# Patient Record
Sex: Female | Born: 1960 | Race: White | Hispanic: No | Marital: Married | Smoking: Former smoker
Health system: Southern US, Community
[De-identification: ages and names within clinical notes are randomized; demographics above are authoritative.]

## PROBLEM LIST (undated history)

## (undated) DIAGNOSIS — K449 Diaphragmatic hernia without obstruction or gangrene: Secondary | ICD-10-CM

## (undated) DIAGNOSIS — N8110 Cystocele, unspecified: Secondary | ICD-10-CM

## (undated) HISTORY — PX: ABDOMINAL HYSTERECTOMY: SHX81

## (undated) HISTORY — PX: TONSILLECTOMY: SUR1361

---

## 2011-12-21 DIAGNOSIS — K449 Diaphragmatic hernia without obstruction or gangrene: Secondary | ICD-10-CM

## 2011-12-21 DIAGNOSIS — N8110 Cystocele, unspecified: Secondary | ICD-10-CM

## 2011-12-21 HISTORY — DX: Cystocele, unspecified: N81.10

## 2011-12-21 HISTORY — DX: Diaphragmatic hernia without obstruction or gangrene: K44.9

## 2015-02-28 ENCOUNTER — Ambulatory Visit: Payer: Self-pay | Admitting: Internal Medicine

## 2015-04-17 ENCOUNTER — Other Ambulatory Visit: Payer: Self-pay | Admitting: Nurse Practitioner

## 2015-04-17 DIAGNOSIS — R1013 Epigastric pain: Principal | ICD-10-CM

## 2015-04-17 DIAGNOSIS — R110 Nausea: Secondary | ICD-10-CM

## 2015-04-17 DIAGNOSIS — G8929 Other chronic pain: Secondary | ICD-10-CM

## 2015-04-23 ENCOUNTER — Ambulatory Visit: Payer: Self-pay

## 2015-04-30 ENCOUNTER — Ambulatory Visit
Admission: RE | Admit: 2015-04-30 | Discharge: 2015-04-30 | Disposition: A | Discharge status: Home / Self Care | Payer: BLUE CROSS/BLUE SHIELD | Source: Ambulatory Visit | Attending: Nurse Practitioner | Admitting: Nurse Practitioner

## 2015-04-30 DIAGNOSIS — K824 Cholesterolosis of gallbladder: Secondary | ICD-10-CM | POA: Diagnosis not present

## 2015-04-30 DIAGNOSIS — R110 Nausea: Secondary | ICD-10-CM

## 2015-04-30 DIAGNOSIS — R1013 Epigastric pain: Secondary | ICD-10-CM | POA: Insufficient documentation

## 2015-04-30 DIAGNOSIS — G8929 Other chronic pain: Secondary | ICD-10-CM

## 2015-04-30 MED ORDER — SINCALIDE 5 MCG IJ SOLR
0.0200 ug/kg | Freq: Once | INTRAMUSCULAR | Status: AC
  Administered 2015-04-30: 1.4 ug via INTRAVENOUS
  Filled 2015-04-30: qty 5

## 2015-04-30 MED ORDER — TECHNETIUM TC 99M MEBROFENIN IV KIT
5.4400 | PACK | Freq: Once | INTRAVENOUS | Status: AC | PRN
  Administered 2015-04-30: 5.44 via INTRAVENOUS

## 2015-06-24 ENCOUNTER — Encounter
Admission: RE | Admit: 2015-06-24 | Discharge: 2015-06-24 | Disposition: A | Discharge status: Home / Self Care | Payer: BLUE CROSS/BLUE SHIELD | Source: Ambulatory Visit | Attending: Surgery | Admitting: Surgery

## 2015-06-24 DIAGNOSIS — Z01812 Encounter for preprocedural laboratory examination: Secondary | ICD-10-CM | POA: Insufficient documentation

## 2015-06-24 DIAGNOSIS — K811 Chronic cholecystitis: Secondary | ICD-10-CM | POA: Diagnosis not present

## 2015-06-24 HISTORY — DX: Diaphragmatic hernia without obstruction or gangrene: K44.9

## 2015-06-24 HISTORY — DX: Cystocele, unspecified: N81.10

## 2015-06-24 LAB — COMPREHENSIVE METABOLIC PANEL
ALT: 12 U/L — ABNORMAL LOW (ref 14–54)
AST: 16 U/L (ref 15–41)
Albumin: 4 g/dL (ref 3.5–5.0)
Alkaline Phosphatase: 84 U/L (ref 38–126)
Anion gap: 7 (ref 5–15)
BILIRUBIN TOTAL: 0.7 mg/dL (ref 0.3–1.2)
BUN: 14 mg/dL (ref 6–20)
CHLORIDE: 106 mmol/L (ref 101–111)
CO2: 28 mmol/L (ref 22–32)
Calcium: 9.3 mg/dL (ref 8.9–10.3)
Creatinine, Ser: 0.72 mg/dL (ref 0.44–1.00)
GFR calc Af Amer: >60 mL/min (ref >60)
GFR calc non Af Amer: >60 mL/min (ref >60)
Glucose, Bld: 90 mg/dL (ref 65–99)
Potassium: 4.3 mmol/L (ref 3.5–5.1)
Sodium: 141 mmol/L (ref 135–145)
Total Protein: 6.8 g/dL (ref 6.5–8.1)

## 2015-06-24 LAB — CBC
HEMATOCRIT: 43.3 % (ref 35.0–47.0)
HEMOGLOBIN: 14.6 g/dL (ref 12.0–16.0)
MCH: 31.6 pg (ref 26.0–34.0)
MCHC: 33.7 g/dL (ref 32.0–36.0)
MCV: 93.7 fL (ref 80.0–100.0)
Platelets: 247 10*3/uL (ref 150–440)
RBC: 4.62 MIL/uL (ref 3.80–5.20)
RDW: 12.6 % (ref 11.5–14.5)
WBC: 7.3 10*3/uL (ref 3.6–11.0)

## 2015-06-24 LAB — DIFFERENTIAL
BASOS ABS: 0.1 10*3/uL (ref 0–0.1)
BASOS PCT: 1 %
Eosinophils Absolute: 0.1 10*3/uL (ref 0–0.7)
Eosinophils Relative: 2 %
Lymphocytes Relative: 25 %
Lymphs Abs: 1.8 10*3/uL (ref 1.0–3.6)
Monocytes Absolute: 0.6 10*3/uL (ref 0.2–0.9)
Monocytes Relative: 8 %
NEUTROS PCT: 64 %
Neutro Abs: 4.6 10*3/uL (ref 1.4–6.5)

## 2015-06-24 NOTE — Patient Instructions (Signed)
  Your procedure is scheduled on: Thursday July 03, 2015 Report to Same Day Surgery. To find out your arrival time please call [phone] between 1PM - 3PM on July 02, 2015  Remember: Instructions that are not followed completely may result in serious medical risk, up to and including death, or upon the discretion of your surgeon and anesthesiologist your surgery may need to be rescheduled.    _x___ 1. Do not eat food or drink liquids after midnight. No gum chewing or hard candies.     _x___ 2. No Alcohol for 24 hours before or after surgery.   ____ 3. Bring all medications with you on the day of surgery if instructed.    _x___ 4. Notify your doctor if there is any change in your medical condition     (cold, fever, infections).     Do not wear jewelry, make-up, hairpins, clips or nail polish.  Do not wear lotions, powders, or perfumes. You may wear deodorant.  Do not shave 48 hours prior to surgery. Men may shave face and neck.  Do not bring valuables to the hospital.    [facility] not [facility] any belongings or valuables.               Contacts, dentures or bridgework may not be worn into surgery.  Leave your suitcase in the car. After surgery it may be brought to your room.  For patients admitted to the hospital, discharge time is determined by your treatment team.   Patients discharged the day of surgery will not be allowed to drive home.    Please read over the following fact sheets that you were given:   [facility] Pre[facility]  __x__ Take these medicines the morning of surgery with A SIP OF WATER:    1. esomeprazole (NEXIUM)    ____ Fleet Enema (as directed)   __x__ Use CHG Soap as directed  ____ Use inhalers on the day of surgery  ____ Stop metformin 2 days prior to surgery    ____ Take 1/2 of usual insulin dose the night before surgery and none on the morning of surgery.   ____ Stop Coumadin/Plavix/aspirin on Does not apply.  ____ Stop  Anti-inflammatories on now.  Tylenol is ok for pain.   ____ Stop supplements until after surgery.    ____ Bring C-Pap to the hospital.

## 2015-07-03 ENCOUNTER — Encounter: Payer: Self-pay | Admitting: *Deleted

## 2015-07-03 ENCOUNTER — Ambulatory Visit: Payer: BLUE CROSS/BLUE SHIELD

## 2015-07-03 ENCOUNTER — Ambulatory Visit
Admission: RE | Admit: 2015-07-03 | Discharge: 2015-07-03 | Disposition: A | Discharge status: Home / Self Care | Payer: BLUE CROSS/BLUE SHIELD | Source: Ambulatory Visit | Attending: Surgery | Admitting: Surgery

## 2015-07-03 ENCOUNTER — Encounter
Admission: RE | Disposition: A | Discharge status: Home / Self Care | Payer: Self-pay | Source: Ambulatory Visit | Attending: Surgery

## 2015-07-03 ENCOUNTER — Ambulatory Visit: Payer: BLUE CROSS/BLUE SHIELD | Admitting: Registered Nurse

## 2015-07-03 DIAGNOSIS — Z8049 Family history of malignant neoplasm of other genital organs: Secondary | ICD-10-CM | POA: Insufficient documentation

## 2015-07-03 DIAGNOSIS — Z8249 Family history of ischemic heart disease and other diseases of the circulatory system: Secondary | ICD-10-CM | POA: Insufficient documentation

## 2015-07-03 DIAGNOSIS — Z87891 Personal history of nicotine dependence: Secondary | ICD-10-CM | POA: Insufficient documentation

## 2015-07-03 DIAGNOSIS — R61 Generalized hyperhidrosis: Secondary | ICD-10-CM | POA: Diagnosis not present

## 2015-07-03 DIAGNOSIS — K449 Diaphragmatic hernia without obstruction or gangrene: Secondary | ICD-10-CM | POA: Insufficient documentation

## 2015-07-03 DIAGNOSIS — Z86010 Personal history of colonic polyps: Secondary | ICD-10-CM | POA: Insufficient documentation

## 2015-07-03 DIAGNOSIS — Z801 Family history of malignant neoplasm of trachea, bronchus and lung: Secondary | ICD-10-CM | POA: Diagnosis not present

## 2015-07-03 DIAGNOSIS — K811 Chronic cholecystitis: Secondary | ICD-10-CM | POA: Diagnosis not present

## 2015-07-03 DIAGNOSIS — K219 Gastro-esophageal reflux disease without esophagitis: Secondary | ICD-10-CM | POA: Insufficient documentation

## 2015-07-03 DIAGNOSIS — Z833 Family history of diabetes mellitus: Secondary | ICD-10-CM | POA: Insufficient documentation

## 2015-07-03 DIAGNOSIS — Z419 Encounter for procedure for purposes other than remedying health state, unspecified: Secondary | ICD-10-CM

## 2015-07-03 DIAGNOSIS — Z79899 Other long term (current) drug therapy: Secondary | ICD-10-CM | POA: Insufficient documentation

## 2015-07-03 HISTORY — PX: CHOLECYSTECTOMY: SHX55

## 2015-07-03 SURGERY — LAPAROSCOPIC CHOLECYSTECTOMY WITH INTRAOPERATIVE CHOLANGIOGRAM
Anesthesia: General | Wound class: Clean Contaminated

## 2015-07-03 MED ORDER — ONDANSETRON HCL 4 MG/2ML IJ SOLN
4.0000 mg | Freq: Once | INTRAMUSCULAR | Status: AC | PRN
  Administered 2015-07-03: 4 mg via INTRAVENOUS

## 2015-07-03 MED ORDER — ONDANSETRON HCL 4 MG/2ML IJ SOLN
INTRAMUSCULAR | Status: AC
  Administered 2015-07-03: 4 mg via INTRAVENOUS
  Filled 2015-07-03: qty 2

## 2015-07-03 MED ORDER — FENTANYL CITRATE (PF) 100 MCG/2ML IJ SOLN
INTRAMUSCULAR | Status: DC | PRN
  Administered 2015-07-03: 50 ug via INTRAVENOUS
  Administered 2015-07-03: 100 ug via INTRAVENOUS

## 2015-07-03 MED ORDER — ONDANSETRON HCL 4 MG/2ML IJ SOLN
INTRAMUSCULAR | Status: DC | PRN
  Administered 2015-07-03: 4 mg via INTRAVENOUS

## 2015-07-03 MED ORDER — FENTANYL CITRATE (PF) 100 MCG/2ML IJ SOLN
25.0000 ug | INTRAMUSCULAR | Status: DC | PRN
  Administered 2015-07-03 (×4): 25 ug via INTRAVENOUS

## 2015-07-03 MED ORDER — PROMETHAZINE HCL 25 MG/ML IJ SOLN
6.2500 mg | Freq: Four times a day (QID) | INTRAMUSCULAR | Status: DC | PRN
  Administered 2015-07-03: 6.25 mg via INTRAVENOUS

## 2015-07-03 MED ORDER — FENTANYL CITRATE (PF) 100 MCG/2ML IJ SOLN
INTRAMUSCULAR | Status: AC
  Administered 2015-07-03: 25 ug via INTRAVENOUS
  Filled 2015-07-03: qty 2

## 2015-07-03 MED ORDER — BUPIVACAINE-EPINEPHRINE (PF) 0.5% -1:200000 IJ SOLN
INTRAMUSCULAR | Status: AC
  Filled 2015-07-03: qty 30

## 2015-07-03 MED ORDER — MIDAZOLAM HCL 2 MG/2ML IJ SOLN
INTRAMUSCULAR | Status: DC | PRN
  Administered 2015-07-03: 2 mg via INTRAVENOUS

## 2015-07-03 MED ORDER — PROMETHAZINE HCL 25 MG/ML IJ SOLN
INTRAMUSCULAR | Status: AC
  Filled 2015-07-03: qty 1

## 2015-07-03 MED ORDER — GLYCOPYRROLATE 0.2 MG/ML IJ SOLN
INTRAMUSCULAR | Status: DC | PRN
  Administered 2015-07-03: 0.2 mg via INTRAVENOUS

## 2015-07-03 MED ORDER — HYDROCODONE-ACETAMINOPHEN 5-325 MG PO TABS: 1.0000 | ORAL_TABLET | ORAL | Status: DC | PRN

## 2015-07-03 MED ORDER — SUGAMMADEX SODIUM 200 MG/2ML IV SOLN
INTRAVENOUS | Status: DC | PRN
  Administered 2015-07-03: 50 mg via INTRAVENOUS

## 2015-07-03 MED ORDER — BUPIVACAINE-EPINEPHRINE 0.5% -1:200000 IJ SOLN
INTRAMUSCULAR | Status: DC | PRN
  Administered 2015-07-03: 23 mL

## 2015-07-03 MED ORDER — LACTATED RINGERS IV SOLN
INTRAVENOUS | Status: DC
  Administered 2015-07-03 (×2): via INTRAVENOUS

## 2015-07-03 MED ORDER — LIDOCAINE HCL 2 % EX GEL
CUTANEOUS | Status: DC | PRN
  Administered 2015-07-03: 1 via TOPICAL

## 2015-07-03 MED ORDER — SODIUM CHLORIDE 0.9 % IJ SOLN
INTRAMUSCULAR | Status: AC
  Filled 2015-07-03: qty 10

## 2015-07-03 MED ORDER — HYDROMORPHONE HCL 1 MG/ML IJ SOLN: 0.2500 mg | INTRAMUSCULAR | Status: DC | PRN

## 2015-07-03 MED ORDER — ROCURONIUM BROMIDE 100 MG/10ML IV SOLN
INTRAVENOUS | Status: DC | PRN
  Administered 2015-07-03: 50 mg via INTRAVENOUS

## 2015-07-03 MED ORDER — KETOROLAC TROMETHAMINE 30 MG/ML IJ SOLN
INTRAMUSCULAR | Status: DC | PRN
  Administered 2015-07-03: 30 mg via INTRAVENOUS

## 2015-07-03 MED ORDER — PROPOFOL 10 MG/ML IV BOLUS
INTRAVENOUS | Status: DC | PRN
  Administered 2015-07-03: 150 mg via INTRAVENOUS

## 2015-07-03 MED ORDER — LIDOCAINE HCL (CARDIAC) 20 MG/ML IV SOLN
INTRAVENOUS | Status: DC | PRN
  Administered 2015-07-03: 100 mg via INTRAVENOUS

## 2015-07-03 MED ORDER — DEXAMETHASONE SODIUM PHOSPHATE 4 MG/ML IJ SOLN
INTRAMUSCULAR | Status: DC | PRN
  Administered 2015-07-03: 10 mg via INTRAVENOUS

## 2015-07-03 MED ORDER — SODIUM CHLORIDE 0.9 % IV SOLN
INTRAVENOUS | Status: DC | PRN
  Administered 2015-07-03: 8 mL

## 2015-07-03 MED ORDER — HEPARIN SODIUM (PORCINE) 5000 UNIT/ML IJ SOLN
INTRAMUSCULAR | Status: AC
  Filled 2015-07-03: qty 1

## 2015-07-03 MED ORDER — SODIUM CHLORIDE 0.9 % IV SOLN
INTRAVENOUS | Status: DC | PRN
  Administered 2015-07-03: 1 mL via INTRAMUSCULAR

## 2015-07-03 SURGICAL SUPPLY — 36 items
APPLIER CLIP ROT 10 11.4 M/L (STAPLE) ×3
CANISTER SUCT 1200ML W/VALVE (MISCELLANEOUS) ×3 IMPLANT
CANNULA DILATOR 12 W/SLV (CANNULA) ×2 IMPLANT
CANNULA DILATOR 12MM W/SLV (CANNULA) ×1
CATH REDDICK CHOLANGI 4FR 50CM (CATHETERS) ×3 IMPLANT
CHLORAPREP W/TINT 26ML (MISCELLANEOUS) ×3 IMPLANT
CLIP APPLIE ROT 10 11.4 M/L (STAPLE) ×1 IMPLANT
CLOSURE WOUND 1/2 X4 (GAUZE/BANDAGES/DRESSINGS) ×1
DRAPE SHEET LG 3/4 BI-LAMINATE (DRAPES) ×3 IMPLANT
GAUZE SPONGE 4X4 12PLY STRL (GAUZE/BANDAGES/DRESSINGS) ×3 IMPLANT
GLOVE BIO SURGEON STRL SZ7.5 (GLOVE) ×3 IMPLANT
GOWN STRL REUS W/ TWL LRG LVL3 (GOWN DISPOSABLE) ×4 IMPLANT
GOWN STRL REUS W/TWL LRG LVL3 (GOWN DISPOSABLE) ×8
IRRIGATION STRYKERFLOW (MISCELLANEOUS) ×1 IMPLANT
IRRIGATOR STRYKERFLOW (MISCELLANEOUS) ×3
IV NS 1000ML (IV SOLUTION) ×2
IV NS 1000ML BAXH (IV SOLUTION) ×1 IMPLANT
KIT RM TURNOVER STRD PROC AR (KITS) ×3 IMPLANT
LABEL OR SOLS (LABEL) ×3 IMPLANT
NEEDLE FILTER BLUNT 18X 1/2SAF (NEEDLE) ×2
NEEDLE FILTER BLUNT 18X1 1/2 (NEEDLE) ×1 IMPLANT
NEEDLE HYPO 22GX1.5 SAFETY (NEEDLE) ×3 IMPLANT
NS IRRIG 500ML POUR BTL (IV SOLUTION) ×3 IMPLANT
PACK LAP CHOLECYSTECTOMY (MISCELLANEOUS) ×3 IMPLANT
PAD GROUND ADULT SPLIT (MISCELLANEOUS) ×3 IMPLANT
SCISSORS METZENBAUM CVD 33 (INSTRUMENTS) ×3 IMPLANT
SEAL FOR SCOPE WARMER C3101 (MISCELLANEOUS) ×3 IMPLANT
SLEEVE ENDOPATH XCEL 5M (ENDOMECHANICALS) ×3 IMPLANT
STRIP CLOSURE SKIN 1/2X4 (GAUZE/BANDAGES/DRESSINGS) ×2 IMPLANT
SUT CHROMIC 5 0 RB 1 27 (SUTURE) ×6 IMPLANT
SUT VIC AB 0 CT2 27 (SUTURE) IMPLANT
SYR 3ML LL SCALE MARK (SYRINGE) ×3 IMPLANT
TROCAR XCEL NON-BLD 11X100MML (ENDOMECHANICALS) ×3 IMPLANT
TROCAR XCEL NON-BLD 5MMX100MML (ENDOMECHANICALS) ×3 IMPLANT
TUBING INSUFFLATOR HI FLOW (MISCELLANEOUS) ×3 IMPLANT
WATER STERILE IRR 1000ML POUR (IV SOLUTION) ×3 IMPLANT

## 2015-07-03 NOTE — H&P (Signed)
  She reports some recent nausea improved with Zofran. No other change in condition.  Discussed plan for surgery

## 2015-07-03 NOTE — Op Note (Signed)
OPERATIVE REPORT  PREOPERATIVE DIAGNOSIS:  Chronic cholecystitis   POSTOPERATIVE DIAGNOSIS: Chronic cholecystitis   PROCEDURE: Laparoscopic cholecystectomy with cholangiogram  ANESTHESIA: General  SURGEON: Renda RollsWilton Jaisha Villacres M.D.  INDICATIONS: She is a history of epigastric pain and nausea and abnormally low cholecystokinin ejection fraction of 13%  With the patient on the operating table in the supine position under general endotracheal anesthesia the abdomen was prepared with ChloraPrep solution and draped in a sterile manner. A short incision was made in the inferior aspect of the umbilicus and carried down to the deep fascia which was grasped with a laryngeal hook. A Veress needle was inserted into the peritoneal cavity. The needle was aspirated and irrigated with a saline solution. The peritoneal cavity was insufflated with carbon dioxide. Initial inspection revealed that there was distention of the stomach. I had the anesthetist insert an oral gastric tube was decompressed the stomach. There were some adhesions between the omentum and the lower abdominal wall. The liver appeared to have a smooth surface.  Another incision was made in the epigastrium slightly to the right of the midline to introduce an 11 mm cannula. 2 incisions were made in the lateral aspect of the right upper quadrant to introduce two  5 mm cannulas.   The gallbladder was retracted towards the right shoulder.   The gallbladder neck was retracted inferiorly and laterally.  The porta hepatis was identified. The gallbladder was mobilized with incision of the visceral peritoneum. The cystic duct was dissected free from surrounding structures. The cystic artery was dissected free from surrounding structures. A critical view of safety was demonstrated. The cystic artery was controlled with 2 endoclips proximally 1 distally and divided. This allowed better traction on the cystic duct. An Endo Clip was placed across the cystic duct  adjacent to the gallbladder neck. An incision was made in the cystic duct to introduce a Reddick catheter. The cholangiogram was done with injection of half-strength Conray 60 dye. This demonstrated the bile ducts and flow of dye into the duodenum. No retained stones were identified. The cholangiogram appeared normal. The Reddick catheter was removed. The cystic duct was doubly ligated with endoclips and divided. . The gallbladder was dissected free from the liver with use of hook and cautery and blunt dissection. Bleeding was minimal and hemostasis was intact. The gallbladder was delivered up through the infraumbilical incision opened and suctioned.  The gallbladder was removed and submitted in formalin for routine pathology. The right upper quadrant was further inspected and aspirated a small amount of serosanguineous fluid and could see that hemostasis was intact. The cannulas were removed. There was some minimal bleeding from the epigastric cannula site. The site was infiltrated with half percent Sensorcaine with epinephrine and hemostasis surgically appear to be intact. Subcutaneous tissues for each wound was further infiltrated with half percent Sensorcaine with epinephrine. The skin incisions were closed with interrupted 5-0 chromic subcutaneous suture benzoin and Steri-Strips. Gauze dressings were applied with paper tape.  The patient appeared to be in satisfactory condition and was prepared for transfer to the recovery room  Renda RollsWilton Toshiye Kever M.D.

## 2015-07-03 NOTE — Transfer of Care (Signed)
Immediate Anesthesia Transfer of Care Note  Patient: Debbie PhillipsNancy Brock  Procedure(s) Performed: Procedure(s): LAPAROSCOPIC CHOLECYSTECTOMY WITH INTRAOPERATIVE CHOLANGIOGRAM (N/A)  Patient Location: PACU  Anesthesia Type:General  Level of Consciousness: sedated  Airway & Oxygen Therapy: Patient Spontanous Breathing and Patient connected to face mask oxygen  Post-op Assessment: Report given to RN and Post -op Vital signs reviewed and stable  Post vital signs: Reviewed and stable  Last Vitals:  Filed Vitals:   07/03/15 1458  BP: 143/80  Pulse: 87  Temp: 36.1 C  Resp: 15    Complications: No apparent anesthesia complications

## 2015-07-03 NOTE — Anesthesia Postprocedure Evaluation (Signed)
  Anesthesia Post-op Note  Patient: Eli PhillipsNancy Wissink  Procedure(s) Performed: Procedure(s): LAPAROSCOPIC CHOLECYSTECTOMY WITH INTRAOPERATIVE CHOLANGIOGRAM (N/A)  Anesthesia type:General  Patient location: PACU  Post pain: Pain level controlled  Post assessment: Post-op Vital signs reviewed, Patient's Cardiovascular Status Stable, Respiratory Function Stable, Patent Airway and No signs of Nausea or vomiting  Post vital signs: Reviewed and stable  Last Vitals:  Filed Vitals:   07/03/15 1458  BP: 143/80  Pulse: 87  Temp: 36.1 C  Resp: 15    Level of consciousness: awake, alert  and patient cooperative  Complications: No apparent anesthesia complications

## 2015-07-03 NOTE — Anesthesia Procedure Notes (Signed)
Procedure Name: Intubation Date/Time: 07/03/2015 1:06 PM Performed by: Stormy FabianURTIS, Sameera Betton Pre-anesthesia Checklist: Patient identified, Patient being monitored, Timeout performed, Emergency Drugs available and Suction available Patient Re-evaluated:Patient Re-evaluated prior to inductionOxygen Delivery Method: Circle system utilized Preoxygenation: Pre-oxygenation with 100% oxygen Intubation Type: IV induction Ventilation: Mask ventilation without difficulty Laryngoscope Size: Mac and 3 Grade View: Grade I Tube type: Oral Tube size: 7.0 mm Number of attempts: 1 Airway Equipment and Method: Stylet Placement Confirmation: ETT inserted through vocal cords under direct vision,  positive ETCO2 and breath sounds checked- equal and bilateral Secured at: 21 cm Tube secured with: Tape Dental Injury: Teeth and Oropharynx as per pre-operative assessment  Comments: Anterior airway; short chin

## 2015-07-03 NOTE — Discharge Instructions (Addendum)
Take Tylenol or Norco if needed for pain.  Take Zofran if needed for nausea.  Remove dressings on Friday. May shower Saturday.AMBULATORY SURGERY  DISCHARGE INSTRUCTIONS   1) The drugs that you were given will stay in your system until tomorrow so for the next 24 hours you should not:  A) Drive an automobile B) Make any legal decisions C) Drink any alcoholic beverage   2) You may resume regular meals tomorrow.  Today it is better to start with liquids and gradually work up to solid foods.  You may eat anything you prefer, but it is better to start with liquids, then soup and crackers, and gradually work up to solid foods.   3) Please notify your doctor immediately if you have any unusual bleeding, trouble breathing, redness and pain at the surgery site, drainage, fever, or pain not relieved by medication.    4) Additional Instructions:      Laparoscopic Cholecystectomy, Care After Refer to this sheet in the next few weeks. These instructions provide you with information on caring for yourself after your procedure. Your health care provider may also give you more specific instructions. Your treatment has been planned according to current medical practices, but problems sometimes occur. Call your health care provider if you have any problems or questions after your procedure. WHAT TO EXPECT AFTER THE PROCEDURE After your procedure, it is typical to have the following:  Pain at your incision sites. You will be given pain medicines to control the pain.  Mild nausea or vomiting. This should improve after the first 24 hours.  Bloating and possibly shoulder pain from the gas used during the procedure. This will improve after the first 24 hours. HOME CARE INSTRUCTIONS   Change bandages (dressings) as directed by your health care provider.  Keep the wound dry and clean. You may wash the wound gently with soap and water. Gently blot or dab the area dry.  Do not take baths or use  swimming pools or hot tubs for 2 weeks or until your health care provider approves.  Only take over-the-counter or prescription medicines as directed by your health care provider.  Continue your normal diet as directed by your health care provider.  Do not lift anything heavier than 10 pounds (4.5 kg) until your health care provider approves.  Do not play contact sports for 1 week or until your health care provider approves. SEEK MEDICAL CARE IF:   You have redness, swelling, or increasing pain in the wound.  You notice yellowish-white fluid (pus) coming from the wound.  You have drainage from the wound that lasts longer than 1 day.  You notice a bad smell coming from the wound or dressing.  Your surgical cuts (incisions) break open. SEEK IMMEDIATE MEDICAL CARE IF:   You develop a rash.  You have difficulty breathing.  You have chest pain.  You have a fever.  You have increasing pain in the shoulders (shoulder strap areas).  You have dizzy episodes or faint while standing.  You have severe abdominal pain.  You feel sick to your stomach (nauseous) or throw up (vomit) and this lasts for more than 1 day. Document Released: 12/06/2005 Document Revised: 09/26/2013 Document Reviewed: 07/18/2013 [facility] Patient Information 2015 [place] [place]. This information is not intended to replace advice given to you by your health care provider. Make sure you discuss any questions you have with your health care provider.   Please contact your physician with any problems or Same Day Surgery  at [phone], Monday through Friday 6 am to 4 pm, or [place] at [facility] number at [phone].

## 2015-07-03 NOTE — Anesthesia Preprocedure Evaluation (Addendum)
Anesthesia Evaluation  Patient identified by MRN, date of birth, ID band Patient awake    Reviewed: Allergy & Precautions, NPO status , Patient's Chart, lab work & pertinent test results  History of Anesthesia Complications Negative for: history of anesthetic complications  Airway Mallampati: II  TM Distance: >3 FB Neck ROM: Full    Dental no notable dental hx.    Pulmonary neg pulmonary ROS, former smoker,  breath sounds clear to auscultation  Pulmonary exam normal       Cardiovascular Exercise Tolerance: Good negative cardio ROS Normal cardiovascular examRhythm:Regular Rate:Normal     Neuro/Psych negative neurological ROS  negative psych ROS   GI/Hepatic Neg liver ROS, hiatal hernia, GERD-  Medicated and Controlled,  Endo/Other  negative endocrine ROS  Renal/GU negative Renal ROS  negative genitourinary   Musculoskeletal negative musculoskeletal ROS (+)   Abdominal   Peds negative pediatric ROS (+)  Hematology negative hematology ROS (+)   Anesthesia Other Findings   Reproductive/Obstetrics negative OB ROS                            Anesthesia Physical Anesthesia Plan  ASA: II  Anesthesia Plan: General   Post-op Pain Management:    Induction: Intravenous  Airway Management Planned: Oral ETT  Additional Equipment:   Intra-op Plan:   Post-operative Plan: Extubation in OR  Informed Consent: I have reviewed the patients History and Physical, chart, labs and discussed the procedure including the risks, benefits and alternatives for the proposed anesthesia with the patient or authorized representative who has indicated his/her understanding and acceptance.   Dental advisory given  Plan Discussed with: CRNA and Surgeon  Anesthesia Plan Comments:         Anesthesia Quick Evaluation

## 2015-07-04 ENCOUNTER — Encounter: Payer: Self-pay | Admitting: Surgery

## 2015-07-07 LAB — SURGICAL PATHOLOGY

## 2015-08-22 ENCOUNTER — Other Ambulatory Visit: Payer: Self-pay | Admitting: Internal Medicine

## 2015-08-22 DIAGNOSIS — R1084 Generalized abdominal pain: Secondary | ICD-10-CM

## 2015-08-29 ENCOUNTER — Ambulatory Visit: Payer: BLUE CROSS/BLUE SHIELD

## 2015-09-01 ENCOUNTER — Ambulatory Visit
Admission: RE | Admit: 2015-09-01 | Discharge: 2015-09-01 | Disposition: A | Discharge status: Home / Self Care | Payer: BLUE CROSS/BLUE SHIELD | Source: Ambulatory Visit | Attending: Internal Medicine | Admitting: Internal Medicine

## 2015-09-01 DIAGNOSIS — R1084 Generalized abdominal pain: Secondary | ICD-10-CM | POA: Insufficient documentation

## 2015-09-01 MED ORDER — IOHEXOL 300 MG/ML  SOLN: 100.0000 mL | Freq: Once | INTRAMUSCULAR | Status: DC | PRN

## 2015-09-01 MED ORDER — IOHEXOL 300 MG/ML  SOLN
50.0000 mL | Freq: Once | INTRAMUSCULAR | Status: AC | PRN
  Administered 2015-09-01: 100 mL via INTRAVENOUS

## 2016-03-09 ENCOUNTER — Other Ambulatory Visit: Payer: Self-pay | Admitting: Obstetrics and Gynecology

## 2016-03-09 DIAGNOSIS — Z1231 Encounter for screening mammogram for malignant neoplasm of breast: Secondary | ICD-10-CM

## 2016-03-18 ENCOUNTER — Ambulatory Visit: Payer: BLUE CROSS/BLUE SHIELD

## 2016-03-22 ENCOUNTER — Ambulatory Visit
Admission: RE | Admit: 2016-03-22 | Discharge: 2016-03-22 | Disposition: A | Discharge status: Home / Self Care | Payer: BLUE CROSS/BLUE SHIELD | Source: Ambulatory Visit | Attending: Obstetrics and Gynecology | Admitting: Obstetrics and Gynecology

## 2016-03-22 DIAGNOSIS — Z1231 Encounter for screening mammogram for malignant neoplasm of breast: Secondary | ICD-10-CM

## 2017-04-26 ENCOUNTER — Other Ambulatory Visit: Payer: Self-pay | Admitting: Obstetrics and Gynecology

## 2017-04-26 DIAGNOSIS — Z1231 Encounter for screening mammogram for malignant neoplasm of breast: Secondary | ICD-10-CM

## 2017-05-13 ENCOUNTER — Ambulatory Visit
Admission: RE | Admit: 2017-05-13 | Discharge: 2017-05-13 | Disposition: A | Discharge status: Home / Self Care | Payer: BLUE CROSS/BLUE SHIELD | Source: Ambulatory Visit | Attending: Obstetrics and Gynecology | Admitting: Obstetrics and Gynecology

## 2017-05-13 DIAGNOSIS — Z1231 Encounter for screening mammogram for malignant neoplasm of breast: Secondary | ICD-10-CM | POA: Insufficient documentation

## 2018-06-02 ENCOUNTER — Other Ambulatory Visit: Payer: Self-pay | Admitting: Internal Medicine

## 2018-06-02 DIAGNOSIS — Z1231 Encounter for screening mammogram for malignant neoplasm of breast: Secondary | ICD-10-CM

## 2018-06-23 ENCOUNTER — Ambulatory Visit
Admission: RE | Admit: 2018-06-23 | Discharge: 2018-06-23 | Disposition: A | Discharge status: Home / Self Care | Payer: BLUE CROSS/BLUE SHIELD | Source: Ambulatory Visit | Attending: Internal Medicine | Admitting: Internal Medicine

## 2018-06-23 DIAGNOSIS — Z1231 Encounter for screening mammogram for malignant neoplasm of breast: Secondary | ICD-10-CM | POA: Insufficient documentation

## 2019-07-09 ENCOUNTER — Ambulatory Visit (INDEPENDENT_AMBULATORY_CARE_PROVIDER_SITE_OTHER): Payer: BC Managed Care – PPO | Admitting: Urology

## 2019-07-09 ENCOUNTER — Other Ambulatory Visit: Payer: Self-pay

## 2019-07-09 ENCOUNTER — Encounter: Payer: Self-pay | Admitting: Urology

## 2019-07-09 VITALS — BP 132/74 | Ht 67.0 in | Wt 171.0 lb

## 2019-07-09 DIAGNOSIS — N8111 Cystocele, midline: Secondary | ICD-10-CM | POA: Diagnosis not present

## 2019-07-09 DIAGNOSIS — R339 Retention of urine, unspecified: Secondary | ICD-10-CM | POA: Diagnosis not present

## 2019-07-09 LAB — BLADDER SCAN AMB NON-IMAGING

## 2019-07-09 NOTE — Progress Notes (Signed)
07/09/2019 10:43 AM   Debbie Newman 01/24/1961 [id number]  Referring provider: Rusty Aus, MD [facility] [place],  [place] [address]  Chief Complaint  Patient presents with  . cystocele    HP I was asked to assess the patient's mild voiding dysfunction over the last few months.  Her primary complaint is that a few minutes after she voids she has a double void a small amount.  Her flow was reasonable and she feels empty.  Moments later she feels like she needs to go.  She describes a history where she was treated as a bladder infection but may have been told that the culture was negative  At baseline she is continent voiding every 3-4 hours and has no nocturia.  She denies a history of kidney stones previously surgery and bladder infections.  No neurologic issues.  Has not had a hysterectomy.  She tends towards loose bowel movements  Modifying factors: There are no other modifying factors  Associated signs and symptoms: There are no other associated signs and symptoms Aggravating and relieving factors: There are no other aggravating or relieving factors Severity: Moderate Duration: Persistent    PMH: Past Medical History:  Diagnosis Date  . Female bladder prolapse 2013  . Hiatal hernia 2013    Surgical History: Past Surgical History:  Procedure Laterality Date  . CESAREAN SECTION  1999  . CHOLECYSTECTOMY N/A 07/03/2015   Procedure: LAPAROSCOPIC CHOLECYSTECTOMY WITH INTRAOPERATIVE CHOLANGIOGRAM;  Surgeon: Leonie Green, MD;  Location: [facility] ORS;  Service: General;  Laterality: N/A;  . TONSILLECTOMY      Home Medications:  Allergies as of 07/09/2019      Reactions   Morphine And Related Nausea And Vomiting   nightmares   Citalopram Other (See Comments), Palpitations   Skin burning.       Medication List       Accurate as of July 09, 2019 10:43 AM. If you have any questions, ask your nurse or doctor.       STOP taking these medications   HYDROcodone-acetaminophen 5-325 MG tablet Commonly known as: Norco Stopped by: Reece Packer, MD   vitamin C 1000 MG tablet Stopped by: Reece Packer, MD   vitamin E 400 UNIT capsule Stopped by: Reece Packer, MD     TAKE these medications   esomeprazole 40 MG capsule Commonly known as: NEXIUM Take 40 mg by mouth every morning.   metoCLOPramide 5 MG tablet Commonly known as: REGLAN Take by mouth.   multivitamin-lutein Caps capsule Take 1 capsule by mouth every morning.   SUPER B COMPLEX PO Take 1 tablet by mouth every morning.       Allergies:  Allergies  Allergen Reactions  . Morphine And Related Nausea And Vomiting    nightmares  . Citalopram Other (See Comments) and Palpitations    Skin burning.     Family History: Family History  Problem Relation Age of Onset  . Breast cancer Maternal Aunt     Social History:  reports that she quit smoking about 21 years ago. She has never used smokeless tobacco. She reports that she does not drink alcohol or use drugs.  ROS: UROLOGY Frequent Urination?: No Hard to postpone urination?: No Burning/pain with urination?: No Get up at night to urinate?: No Leakage of urine?: No Urine stream starts and stops?: Yes Trouble starting stream?: No Do you have to strain to urinate?: No Blood in urine?: No Urinary tract infection?:  No Sexually transmitted disease?: No Injury to kidneys or bladder?: No Painful intercourse?: No Weak stream?: No Currently pregnant?: No Vaginal bleeding?: No Last menstrual period?: n  Gastrointestinal Nausea?: No Vomiting?: No Indigestion/heartburn?: No Diarrhea?: No Constipation?: No  Constitutional Fever: No Night sweats?: No Weight loss?: No Fatigue?: No  Skin Skin rash/lesions?: No Itching?: No  Eyes Blurred vision?: No Double vision?: No  Ears/Nose/Throat Sore throat?: No Sinus problems?: No  Hematologic/Lymphatic  Swollen glands?: No Easy bruising?: No  Cardiovascular Leg swelling?: No Chest pain?: No  Respiratory Cough?: No Shortness of breath?: No  Endocrine Excessive thirst?: No  Musculoskeletal Back pain?: No Joint pain?: No  Neurological Headaches?: No Dizziness?: No  Psychologic Depression?: No Anxiety?: No  Physical Exam: BP 132/74   Ht 5\' 7"  (1.702 m)   Wt 171 lb (77.6 kg)   BMI 26.78 kg/m   Constitutional:  Alert and oriented, No acute distress. HEENT: [place] AT, moist mucus membranes.  Trachea midline, no masses. Cardiovascular: No clubbing, cyanosis, or edema. Respiratory: Normal respiratory effort, no increased work of breathing. GI: Abdomen is soft, nontender, nondistended, no abdominal masses GU: On pelvic examination patient had a moderate size grade 2 cystocele with central defect that did not descend further with coughing or Valsalva maneuver.  She had a grade 1 rectocele.  Mild hypermobility of bladder neck and no stress incontinence.  No urethral diverticulum or urethral tenderness.  Prolapse is mildly symptomatic Skin: No rashes, bruises or suspicious lesions. Lymph: No cervical or inguinal adenopathy. Neurologic: Grossly intact, no focal deficits, moving all 4 extremities. Psychiatric: Normal mood and affect.  Laboratory Data: Lab Results  Component Value Date   WBC 7.3 06/24/2015   HGB 14.6 06/24/2015   HCT 43.3 06/24/2015   MCV 93.7 06/24/2015   PLT 247 06/24/2015    Lab Results  Component Value Date   CREATININE 0.72 06/24/2015    No results found for: PSA  No results found for: TESTOSTERONE  No results found for: HGBA1C  Urinalysis No results found for: COLORURINE, APPEARANCEUR, LABSPEC, PHURINE, GLUCOSEU, HGBUR, BILIRUBINUR, KETONESUR, PROTEINUR, UROBILINOGEN, NITRITE, LEUKOCYTESUR  Pertinent Imaging:   Assessment & Plan: The patient really has minimal voiding dysfunction.  She empties efficiently.  Pathophysiology discussed.  I did  not have a urine to send for culture.  Picture was drawn regarding mildly symptomatic cystocele.  It is not clinically relevant.  She is not bothered by it.  The patient may have had a bladder infection and now is having some bladder overactivity a few minutes after voiding.  She understands the role of overactive bladder medication.  We talked about bladder irritants.  We talked about urge suppression techniques and referral to physical therapy if bothersome  Assurance given.  See as needed  There are no diagnoses linked to this encounter.  No follow-ups on file.  Martina SinnerScott A Omaya Nieland, MD  [facility] [address], [address] [place], [place] [address] [phone]

## 2019-07-16 ENCOUNTER — Other Ambulatory Visit: Payer: Self-pay | Admitting: Internal Medicine

## 2019-07-16 DIAGNOSIS — Z1231 Encounter for screening mammogram for malignant neoplasm of breast: Secondary | ICD-10-CM

## 2019-08-22 ENCOUNTER — Ambulatory Visit
Admission: RE | Admit: 2019-08-22 | Discharge: 2019-08-22 | Disposition: A | Discharge status: Home / Self Care | Payer: BC Managed Care – PPO | Source: Ambulatory Visit | Attending: Internal Medicine | Admitting: Internal Medicine

## 2019-08-22 DIAGNOSIS — Z1231 Encounter for screening mammogram for malignant neoplasm of breast: Secondary | ICD-10-CM | POA: Insufficient documentation

## 2020-10-08 ENCOUNTER — Other Ambulatory Visit: Payer: Self-pay | Admitting: Internal Medicine

## 2020-10-08 DIAGNOSIS — Z1231 Encounter for screening mammogram for malignant neoplasm of breast: Secondary | ICD-10-CM

## 2020-11-11 ENCOUNTER — Other Ambulatory Visit: Payer: Self-pay

## 2020-11-11 ENCOUNTER — Ambulatory Visit
Admission: RE | Admit: 2020-11-11 | Discharge: 2020-11-11 | Disposition: A | Discharge status: Home / Self Care | Payer: BC Managed Care – PPO | Source: Ambulatory Visit | Attending: Internal Medicine | Admitting: Internal Medicine

## 2020-11-11 DIAGNOSIS — Z1231 Encounter for screening mammogram for malignant neoplasm of breast: Secondary | ICD-10-CM | POA: Diagnosis present

## 2021-12-22 ENCOUNTER — Other Ambulatory Visit: Payer: Self-pay | Admitting: Internal Medicine

## 2021-12-22 DIAGNOSIS — Z1231 Encounter for screening mammogram for malignant neoplasm of breast: Secondary | ICD-10-CM

## 2021-12-28 ENCOUNTER — Ambulatory Visit
Admission: RE | Admit: 2021-12-28 | Discharge: 2021-12-28 | Disposition: A | Discharge status: Home / Self Care | Payer: BC Managed Care – PPO | Source: Ambulatory Visit | Attending: Internal Medicine | Admitting: Internal Medicine

## 2021-12-28 ENCOUNTER — Other Ambulatory Visit: Payer: Self-pay

## 2021-12-28 DIAGNOSIS — Z1231 Encounter for screening mammogram for malignant neoplasm of breast: Secondary | ICD-10-CM | POA: Diagnosis not present

## 2022-01-05 ENCOUNTER — Emergency Department: Payer: BC Managed Care – PPO

## 2022-01-05 ENCOUNTER — Emergency Department
Admission: EM | Admit: 2022-01-05 | Discharge: 2022-01-05 | Disposition: A | Discharge status: Home / Self Care | Payer: BC Managed Care – PPO | Attending: Emergency Medicine | Admitting: Emergency Medicine

## 2022-01-05 DIAGNOSIS — R42 Dizziness and giddiness: Secondary | ICD-10-CM | POA: Insufficient documentation

## 2022-01-05 DIAGNOSIS — R55 Syncope and collapse: Secondary | ICD-10-CM | POA: Diagnosis not present

## 2022-01-05 LAB — CBC
HCT: 44.2 % (ref 36.0–46.0)
Hemoglobin: 14.7 g/dL (ref 12.0–15.0)
MCH: 30.6 pg (ref 26.0–34.0)
MCHC: 33.3 g/dL (ref 30.0–36.0)
MCV: 91.9 fL (ref 80.0–100.0)
Platelets: 332 10*3/uL (ref 150–400)
RBC: 4.81 MIL/uL (ref 3.87–5.11)
RDW: 12.1 % (ref 11.5–15.5)
WBC: 7.9 10*3/uL (ref 4.0–10.5)
nRBC: 0 % (ref 0.0–0.2)

## 2022-01-05 LAB — BASIC METABOLIC PANEL
Anion gap: 6 (ref 5–15)
BUN: 9 mg/dL (ref 8–23)
CO2: 26 mmol/L (ref 22–32)
Calcium: 8.9 mg/dL (ref 8.9–10.3)
Chloride: 108 mmol/L (ref 98–111)
Creatinine, Ser: 0.76 mg/dL (ref 0.44–1.00)
GFR, Estimated: >60 mL/min (ref >60)
Glucose, Bld: 99 mg/dL (ref 70–99)
Potassium: 3.7 mmol/L (ref 3.5–5.1)
Sodium: 140 mmol/L (ref 135–145)

## 2022-01-05 LAB — TROPONIN I (HIGH SENSITIVITY): Troponin I (High Sensitivity): <2 ng/L (ref <18)

## 2022-01-05 LAB — BRAIN NATRIURETIC PEPTIDE: B Natriuretic Peptide: 69.3 pg/mL (ref 0.0–100.0)

## 2022-01-05 NOTE — Discharge Instructions (Signed)
Drink plenty of fluids  Do not drive until you speak with Dr. Hyacinth Meeker  Follow-up tomorrow as scheduled

## 2022-01-05 NOTE — ED Notes (Signed)
Patient Alert and oriented to baseline. Stable and ambulatory to baseline. Patient verbalized understanding of the discharge instructions.  Patient belongings were taken by the patient.   

## 2022-01-05 NOTE — ED Provider Notes (Signed)
[facility] Provider Note    None    (approximate)   History   Loss of Consciousness   HPI  Debbie Newman is a 61 y.o. female with no significant past medical history here with syncopal episode.  Patient states that she was sitting in her car waiting to pick up groceries at [name] this morning.  She states she felt well while driving.  She states she was looking down texting on her phone.  She then had a very cute onset of sensation of warmth/hot flash, lasting only several seconds.  She felt mildly lightheaded then believes she syncopized.  This lasted only very briefly and she woke back up.  She remembers someone coming to give her her groceries.  She states she called her PCP and was told to come to the ED for further evaluation.  She denies any current complaints.  No recent medication changes.  No h/o syncope. No cardiac history.     Physical Exam   Triage Vital Signs: ED Triage Vitals [01/05/22 1253]  Enc Vitals Group     BP 123/86     Pulse Rate 82     Resp 16     Temp 98.2 F (36.8 C)     Temp Source Oral     SpO2 99 %     Weight      Height      Head Circumference      Peak Flow      Pain Score 0     Pain Loc      Pain Edu?      Excl. in GC?     Most recent vital signs: Vitals:   01/05/22 1253 01/05/22 1500  BP: 123/86 117/68  Pulse: 82 77  Resp: 16 16  Temp: 98.2 F (36.8 C)   SpO2: 99% 98%     General: Awake, no distress.  CV:  Good peripheral perfusion. Normal rate and rhythm, no murmurs. 2+ radial pulses bilaterally. Resp:  Normal effort. Breath sounds CTAB. No w/r/r. Abd:  No distention. No tenderness, rebound, or guarding. Other:  CNII-XII intact. Normal strength and sensation. No dysmetria. Normal tone.   ED Results / Procedures / Treatments   Labs (all labs ordered are listed, but only abnormal results are displayed) Labs Reviewed  BASIC METABOLIC PANEL  CBC  BRAIN NATRIURETIC PEPTIDE  URINALYSIS, ROUTINE  W REFLEX MICROSCOPIC  CBG MONITORING, ED  TROPONIN I (HIGH SENSITIVITY)     EKG Normal sinus rhythm, trickle rate 86.  PR 144, QRS 86, QTc 440.  No acute ST elevations or depressions.  EKG evidence of acute ischemic infarct.   RADIOLOGY Chest x-ray: No acute disease on my review, agree with radiology assessment CT head: No acute intracranial normality, agree with radiology assessment    PROCEDURES:  Critical Care performed: No  .1-3 Lead EKG Interpretation Performed by: Shaune Pollack, MD Authorized by: Shaune Pollack, MD     Interpretation: normal     ECG rate:  80-100   ECG rate assessment: normal     Rhythm: sinus rhythm     Ectopy: none     Conduction: normal   Comments:     Indication: Syncope    MEDICATIONS ORDERED IN ED: Medications - No data to display   IMPRESSION / MDM / ASSESSMENT AND PLAN / ED COURSE  I reviewed the triage vital signs and the nursing notes.  The patient is on the cardiac monitor to evaluate for evidence of arrhythmia and/or significant heart rate changes.   Ddx:  Vasovagal syncope, orthostasis, transient arrhythmia, carotid disease   MDM:  Very pleasant 61 year old female with no significant past medical history here with syncopal episode.  The patient is overall very well-appearing and in no distress.  EKG shows normal intervals and no evidence of arrhythmia.  Telemetry was monitored with no ectopy or arrhythmia.  Broad lab work sent, reviewed, and is reassuring.  BMP shows normal electrolytes.  CBC without anemia or leukocytosis.  Troponin undetectable, do not suspect ACS.  BNP normal.  She has no tachycardia, tachypnea, hypoxia, cough, or evidence to suggest PE.  Chest x-ray reviewed and is clear.  CT head shows no acute abnormality and she has a nonfocal neurological examination.  I reviewed her PCP notes, including recent visit in October 2022, patient has no significant underlying risk factors for  syncope or valvular disease.  Will have her follow-up tomorrow as she has an appointment scheduled with her PCP, for further evaluation including possible echocardiogram and cardiac monitoring as indicated.  Return precautions given.   MEDICATIONS GIVEN IN ED: Medications - No data to display   Consults:  None   EMR reviewed  PCP visit 09/2021, 06/2021 with Dr. Hyacinth Meeker     FINAL CLINICAL IMPRESSION(S) / ED DIAGNOSES   Final diagnoses:  Syncope, unspecified syncope type     Rx / DC Orders   ED Discharge Orders     None        Note:  This document was prepared using Dragon voice recognition software and may include unintentional dictation errors.   Shaune Pollack, MD 01/05/22 [phone]

## 2022-01-05 NOTE — ED Triage Notes (Signed)
Pt presents to ED with c/o of having a syncopal episode that happened this morning. Pt states waiting to pick up groceries when "she fell out".   Pt states she has never had this happen. Pt is NAD at this time. Pt states she felt nauseous and "weird feeling over". Pt is A&Ox4.

## 2022-01-18 ENCOUNTER — Other Ambulatory Visit: Payer: Self-pay | Admitting: Student

## 2022-01-18 DIAGNOSIS — M25531 Pain in right wrist: Secondary | ICD-10-CM

## 2022-01-18 DIAGNOSIS — G8929 Other chronic pain: Secondary | ICD-10-CM

## 2022-01-18 DIAGNOSIS — S63591A Other specified sprain of right wrist, initial encounter: Secondary | ICD-10-CM

## 2022-01-27 ENCOUNTER — Ambulatory Visit
Admission: RE | Admit: 2022-01-27 | Discharge: 2022-01-27 | Disposition: A | Discharge status: Home / Self Care | Payer: BC Managed Care – PPO | Source: Ambulatory Visit | Attending: Student | Admitting: Student

## 2022-01-27 DIAGNOSIS — M25531 Pain in right wrist: Secondary | ICD-10-CM | POA: Insufficient documentation

## 2022-01-27 DIAGNOSIS — S63591A Other specified sprain of right wrist, initial encounter: Secondary | ICD-10-CM

## 2022-01-27 DIAGNOSIS — X58XXXA Exposure to other specified factors, initial encounter: Secondary | ICD-10-CM | POA: Insufficient documentation

## 2022-01-27 DIAGNOSIS — G8929 Other chronic pain: Secondary | ICD-10-CM | POA: Diagnosis present

## 2022-01-27 MED ORDER — LIDOCAINE HCL (PF) 1 % IJ SOLN
5.0000 mL | Freq: Once | INTRAMUSCULAR | Status: AC
  Administered 2022-01-27: 5 mL via INTRADERMAL
  Filled 2022-01-27: qty 5

## 2022-01-27 MED ORDER — SODIUM CHLORIDE (PF) 0.9 % IJ SOLN
10.0000 mL | INTRAMUSCULAR | Status: DC | PRN
  Administered 2022-01-27: 10 mL via INTRAVENOUS

## 2022-01-27 MED ORDER — IOHEXOL 180 MG/ML  SOLN
20.0000 mL | Freq: Once | INTRAMUSCULAR | Status: AC | PRN
  Administered 2022-01-27: 20 mL

## 2022-01-27 MED ORDER — GADOBUTROL 1 MMOL/ML IV SOLN
0.0500 mL | Freq: Once | INTRAVENOUS | Status: AC | PRN
  Administered 2022-01-27: 0.05 mL

## 2022-02-09 ENCOUNTER — Encounter: Payer: Self-pay | Admitting: Occupational Therapy

## 2022-02-09 ENCOUNTER — Ambulatory Visit: Payer: BC Managed Care – PPO | Attending: Student | Admitting: Occupational Therapy

## 2022-02-09 DIAGNOSIS — M6281 Muscle weakness (generalized): Secondary | ICD-10-CM | POA: Insufficient documentation

## 2022-02-09 DIAGNOSIS — M25631 Stiffness of right wrist, not elsewhere classified: Secondary | ICD-10-CM | POA: Diagnosis present

## 2022-02-09 DIAGNOSIS — M25531 Pain in right wrist: Secondary | ICD-10-CM | POA: Diagnosis not present

## 2022-02-09 NOTE — Therapy (Signed)
[place] [facility] PHYSICAL AND SPORTS MEDICINE [address], [place], [address] Phone: [phone]   Fax:  [phone]  Occupational Therapy Evaluation  Patient Details  Name: Debbie Newman MRN: [id number] Date of Birth: 10-08-1961 Referring Provider (OT): Marney Doctor   Encounter Date: 02/09/2022   OT End of Session - 02/09/22 1342     Visit Number 1    Number of Visits 4    Date for OT Re-Evaluation 03/23/22    OT Start Time 1031    OT Stop Time 1116    OT Time Calculation (min) 45 min    Activity Tolerance Patient tolerated treatment well    Behavior During Therapy [facility] for tasks assessed/performed             Past Medical History:  Diagnosis Date   Female bladder prolapse 2013   Hiatal hernia 2013    Past Surgical History:  Procedure Laterality Date   CESAREAN SECTION  1999   CHOLECYSTECTOMY N/A 07/03/2015   Procedure: LAPAROSCOPIC CHOLECYSTECTOMY WITH INTRAOPERATIVE CHOLANGIOGRAM;  Surgeon: Nadeen Landau, MD;  Location: [facility] ORS;  Service: General;  Laterality: N/A;   TONSILLECTOMY      There were no vitals filed for this visit.   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 They were last evaluated by Horris Latino, PA on 01/15/2022. At that time, the plan was to perform a right wrist MRI arthrogram, use a short arm wrist brace, then follow-up after MRI. She had her MRI on 01/27/2022. This showed advanced degenerative changes of the  TFCC, advanced articular cartilage thinning at the radiocarpal and intercarpal joints, subchondral cystic change in the lunate/triquetrum/hamate/ulna suspicious for abutment syndrome, small volar wrist ganglion cyst. She was recommended follow-up with reevaluation and consideration of diagnostic/therapeutic injection to help determine her primary pain generator    Patient Stated Goals Would like my pain in my wrist to be better to be able to use it more without wearing splint all the time    Currently in Pain? 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, cystic changes , cyst radial side ,tendinitis ECU    Referring Provider (OT) Marney Doctor    Onset Date/Surgical Date 10/20/21    Hand Dominance Right      Home  Environment   Lives With Spouse      Prior Function   Vocation Full time employment    Leisure caretaker of 2 people , house work  AROM   Right Forearm Pronation 90 Degrees    Right Forearm Supination 90 Degrees   pain and clicking ulnar side of wrist 2/10   Right Wrist Extension 62 Degrees   2/10 pain   Right Wrist Flexion 80 Degrees   1/10 pain   Right Wrist Radial Deviation 22 Degrees   2/10 pain   Right Wrist Ulnar Deviation 25 Degrees      Strength   Right Hand Grip (lbs) 44    Right Hand Lateral Pinch 11 lbs   pain thumb MC   Right Hand 3 Point Pinch 11 lbs    Left Hand Grip (lbs) 45    Left Hand Lateral Pinch 10 lbs    Left Hand 3 Point Pinch 11 lbs      Right Hand AROM   R Thumb Radial ABduction/ADduction 0-55 WNL   no pain   R Thumb Palmar ABduction/ADduction 0-45 WNL   no pain   R Thumb Opposition to Index --   WNL no pain                     OT  Treatments/Exercises (OP) - 02/09/22 0001       RUE Paraffin   Number Minutes Paraffin 8 Minutes    RUE Paraffin Location Hand   wrist   Comments prior to ROM decrease stiffness and pain             Pt to do at home contrast or heat - prior to [facility] for wrist in all planes - pain free  Wrist splint heavy act- and Benik neoprene for light activities  Ed on and hand out review on joint protection principles        OT Education - 02/09/22 1342     Education Details findings of eval and HEP    Person(s) Educated Patient    Methods Explanation;Demonstration;Tactile cues;Verbal cues;Handout    Comprehension Verbal cues required;Returned demonstration;Verbalized understanding                 OT Long Term Goals - 02/09/22 1356       OT LONG TERM GOAL #1   Title Pt to be independent in HEP to increase  R wrist AROM to WNL to wear splint 50% of time and pain less than 2/10    Baseline wrist splint more than 85% of time - pain 2-8/10 and decrease wrist AROM in all planes    Time 4    Period Weeks    Status New    Target Date 03/09/22      OT LONG TERM GOAL #2   Title Pt to verbalize and demo 3 joint protection or modifications /AE to use to decrease pain in R wrist and ease of doing tasks in ADL's and IADL's    Baseline no knowledge    Time 6    Period Weeks    Status New    Target Date 03/23/22      OT LONG TERM GOAL #3   Title Pt show show increase grip and prenhension by 1-3 lbs without pain to wear splint less than 50% of time or more wrist wrap    Baseline 85% or more in wrist splint -pain 2-8/10 with use - grip R 44,L 45 ,  3 point 11 lbs , lat grip R 11 , L 10 lbs    Time 6    Period Weeks    Status New    Target Date  03/23/22                   Plan - 02/09/22 1343     Clinical Impression Statement Pt report having pain in R wrist for a while and wearing wrist splint since Nov 2022 - She had her MRI on 01/27/2022. 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 She had on 02/01/22 diagnostic/therapeutic injection to help determine her primary pain generator. Pt also refer to OT to decrease pain and increase AROM and strength for functional use- pt show decrease AROM for wrist in all planes with pain this date 2/10 for end range, some popping end range supination on ulnar wrist 2/10. Did not had pain today with making fist or with thumb PA and RA as well as opposition. Grip and prehension  on the lower range for her age - she do report pain can increase to 8/10 on radial wrist with lifting, pushing, pulling, cutting , rotations or twisting. Pt ed on HEP for increasing AROM for wrist without increasing pain, use of wrist splint with heavy activities and wrist wrap for lighter - pt to keep pain under 2/10 - and ed and reviewed hand out about joint protection  principles and modifications - pt to cont at home with homeprogram and follow up next week.    OT Occupational Profile and History Problem Focused Assessment - Including review of records relating to presenting problem    Occupational performance deficits (Please refer to evaluation for details): ADL's;IADL's;Work;Play;Leisure;Social Participation    Body Structure / Function / Physical Skills ADL;Decreased knowledge of use of DME;Strength;Pain;UE functional use;ROM;IADL;Flexibility    Rehab Potential Good    Clinical Decision Making Limited treatment options, no task modification necessary    Comorbidities Affecting Occupational Performance: May have comorbidities impacting occupational performance   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   Modification or Assistance to Complete Evaluation  No modification of tasks or assist necessary to complete eval    OT Frequency 1x /  week   1 x wk to biweekly depending on progress   OT Duration 6 weeks    OT Treatment/Interventions Self-care/ADL training;Paraffin;Fluidtherapy;Contrast Bath;Therapeutic exercise;Patient/family education;Manual Therapy;Passive range of motion;Splinting;DME and/or AE instruction    Consulted and Agree with Plan of Care Patient             Patient will benefit from skilled therapeutic intervention in order to improve the following deficits and impairments:   Body Structure / Function / Physical Skills: ADL, Decreased knowledge of use of DME, Strength, Pain, UE functional use, ROM, IADL, Flexibility       Visit Diagnosis: Pain in right wrist - Plan: Ot plan of care cert/re-cert  Stiffness of right wrist, not elsewhere classified - Plan: Ot plan of care cert/re-cert  Muscle weakness (generalized) - Plan: Ot plan of care cert/re-cert    Problem List There are no problems to display for this patient.   Oletta Cohn, OTR/L,CLT 02/09/2022, 2:01 PM  [place] [facility] PHYSICAL AND SPORTS MEDICINE [address], [place], [address] Phone: [phone]   Fax:  [phone]  Name: Debbie Newman MRN: [id number] Date of Birth: 29-Nov-1961

## 2022-02-18 ENCOUNTER — Ambulatory Visit: Payer: BC Managed Care – PPO | Attending: Student | Admitting: Occupational Therapy

## 2022-02-18 ENCOUNTER — Other Ambulatory Visit: Payer: Self-pay

## 2022-02-18 DIAGNOSIS — G8929 Other chronic pain: Secondary | ICD-10-CM | POA: Diagnosis present

## 2022-02-18 DIAGNOSIS — M25531 Pain in right wrist: Secondary | ICD-10-CM | POA: Diagnosis not present

## 2022-02-18 DIAGNOSIS — R2689 Other abnormalities of gait and mobility: Secondary | ICD-10-CM | POA: Diagnosis present

## 2022-02-18 DIAGNOSIS — M25561 Pain in right knee: Secondary | ICD-10-CM | POA: Insufficient documentation

## 2022-02-18 DIAGNOSIS — R278 Other lack of coordination: Secondary | ICD-10-CM | POA: Insufficient documentation

## 2022-02-18 DIAGNOSIS — M6281 Muscle weakness (generalized): Secondary | ICD-10-CM | POA: Diagnosis present

## 2022-02-18 DIAGNOSIS — M25631 Stiffness of right wrist, not elsewhere classified: Secondary | ICD-10-CM | POA: Diagnosis present

## 2022-02-18 DIAGNOSIS — M5442 Lumbago with sciatica, left side: Secondary | ICD-10-CM | POA: Diagnosis present

## 2022-02-18 NOTE — Therapy (Signed)
[facility] PHYSICAL [facility] SPORTS MEDICINE [address], [place], [address] Phone: [phone]   Fax:  [phone]  Occupational Therapy Treatment  Patient Details  Name: Debbie Newman MRN: [id number] Date of Birth: [birth year] Referring Provider (OT): Marney Doctor   Encounter Date: 02/18/2022   OT End of Session - 02/18/22 0945     Visit Number 2    Number of Visits 4    Date for OT Re-Evaluation 03/23/22    OT Start Time 0906    OT Stop Time 0944    OT Time Calculation (min) 38 min    Activity Tolerance Patient tolerated treatment well    Behavior During Therapy [facility] for tasks assessed/performed             Past Medical History:  Diagnosis Date   Female bladder prolapse 2013   Hiatal hernia 2013    Past Surgical History:  Procedure Laterality Date   CESAREAN SECTION  1999   CHOLECYSTECTOMY N/A 07/03/2015   Procedure: LAPAROSCOPIC CHOLECYSTECTOMY WITH INTRAOPERATIVE CHOLANGIOGRAM;  Surgeon: Nadeen Landau, MD;  Location: [facility] ORS;  Service: General;  Laterality: N/A;   TONSILLECTOMY      There were no vitals filed for this visit.   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 They were last evaluated by Horris Latino, PA on 01/15/2022. At that time, the plan was to perform a right wrist MRI arthrogram, use a short arm wrist brace, then follow-up after MRI. She had her MRI on 01/27/2022. 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 She was recommended follow-up with reevaluation and consideration of diagnostic/therapeutic injection to help determine her primary pain generator    Patient Stated Goals Would like my pain in my wrist to be better to be able to use it more without wearing splint all the time    Currently in Pain? Yes    Pain Score 3     Pain Location Hand    Pain Orientation Right   ulnar   Pain Descriptors / Indicators Aching;Tender    Pain Type Chronic pain    Pain Onset More than a month ago    Pain Frequency Intermittent                [facility] OT Assessment - 02/18/22 0001       AROM   Right Forearm Pronation 90 Degrees    Right Forearm Supination 90 Degrees   pain with resistance   Right Wrist Extension 70 Degrees    Right Wrist Flexion 80 Degrees    Right Wrist Radial Deviation 22 Degrees   2/10 pain   Right Wrist Ulnar Deviation 40 Degrees   3/10     Strength   Right Hand Grip (lbs) 44    Right Hand Lateral Pinch 10 lbs   pain   Right Hand 3 Point Pinch 12 lbs  Left Hand Grip (lbs) 44    Left Hand Lateral Pinch 11 lbs    Left Hand 3 Point Pinch 12 lbs      Right Hand AROM   R Thumb Radial ABduction/ADduction 0-55 WNL    R Thumb Palmar ABduction/ADduction 0-45 WNL    R Thumb Opposition to Index --   WNL - no pain           increase AROM and maintain grip and prehension- pain same  But spike only one time to 7/10  AROM pain 2-3/10 mostly resistance sup, AROM RD, UD end range  And lat grip - pain 2/10           OT Treatments/Exercises (OP) - 02/18/22 0001       RUE Contrast Bath   Time 8 minutes    Comments prior to soft tissue and wrist AAROM pain free               Pt to do at home contrast - prior to [facility] for wrist in all planes - pain free  Wrist splint heavy act- and Benik neoprene on L  Ed on and hand out review on joint protection principles last time  -and reinforce again  Soft tissue mobs to [facility] and CT spreads and webspace   Joint mobs - traction and compression to wrist - no pain  Tender over FCU and TFCC - less pain with AROM and gripping reported on radial wrist  AAROM and AROM for wrist in all planes - pain free range          OT Education - 02/18/22 0945     Education Details progress and HEP    Person(s) Educated Patient    Methods Explanation;Demonstration;Tactile cues;Verbal cues;Handout    Comprehension Verbal cues required;Returned demonstration;Verbalized understanding                 OT Long Term Goals - 02/09/22 1356       OT LONG TERM GOAL #1   Title Pt to be independent in HEP to increase  R wrist AROM to WNL to wear splint 50% of time and pain less than 2/10    Baseline wrist splint more than 85% of time - pain 2-8/10 and decrease wrist AROM in all planes    Time 4    Period Weeks    Status New    Target Date 03/09/22      OT LONG TERM GOAL #2   Title Pt to verbalize and demo 3 joint protection or modifications /AE to use to decrease pain in R wrist and ease of doing tasks in ADL's and IADL's    Baseline no knowledge    Time 6    Period Weeks    Status New    Target Date 03/23/22      OT LONG TERM GOAL #3   Title Pt show show increase grip and prenhension by 1-3 lbs without pain to wear splint less than 50% of time or more wrist wrap    Baseline 85% or more in wrist splint -pain 2-8/10 with use - grip R 44,L 45 ,  3 point 11 lbs , lat grip R 11 , L 10 lbs    Time 6    Period Weeks    Status New    Target Date 03/23/22                   Plan - 02/18/22 0945  Clinical Impression Statement Pt report having pain in R wrist for a while and wearing wrist splint since Nov 2022 - She had her MRI on 01/27/2022. 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 She had on 02/01/22 diagnostic/therapeutic injection to help determine her primary pain generator. Pt also refer to OT to decrease pain and increase AROM and strength for functional use- since eval last week she showed increase AROM at wrist to WNL but pain still 2-3/10 - no popping today at supination end range. Cont not to have pain with making fist or with thumb PA and RA as well as opposition. Grip and prehension  on the lower range for her age and same as last week at eval- pain only with lat grip. Pain this date increase one time to 7/10 but other wise decrease. Pt to assess over the next week to 2 wks is pain spike not as hight, resting pain better - or pain better precent of the day. Pt ed on HEP for increasing AROM for wrist without increasing pain, use of wrist splint with heavy activities cont same HEP- had increase pain with use of wrist wrap. Pt to use preventitive on the L wrist - pt to keep pain under 2/10 - at eval last week ed and reviewed hand out about joint protection  principles and modifications - pt to cont at home with homeprogram and follow up in 10 days.    OT Occupational Profile and History Problem Focused Assessment - Including review of records relating to presenting problem    Occupational performance deficits (Please refer to evaluation for details): ADL's;IADL's;Work;Play;Leisure;Social Participation    Body Structure / Function / Physical Skills ADL;Decreased knowledge of use of DME;Strength;Pain;UE functional use;ROM;IADL;Flexibility    Rehab Potential Good    Clinical Decision Making Limited treatment options, no task modification necessary    Comorbidities Affecting Occupational Performance: May have comorbidities impacting occupational performance    Modification or Assistance to Complete Evaluation  No modification of tasks or assist necessary to complete eval    OT Frequency  1x / week    OT Duration 6 weeks    OT Treatment/Interventions Self-care/ADL training;Paraffin;Fluidtherapy;Contrast Bath;Therapeutic exercise;Patient/family education;Manual Therapy;Passive range of motion;Splinting;DME and/or AE instruction    Consulted and Agree with Plan of Care Patient             Patient will benefit from skilled therapeutic intervention in order to improve the following deficits and impairments:   Body Structure / Function / Physical Skills: ADL, Decreased knowledge of use of DME, Strength, Pain, UE functional use, ROM, IADL, Flexibility       Visit Diagnosis: Pain in right wrist  Muscle weakness (generalized)    Problem List There are no problems to display for this patient.   Oletta Cohn, OTR/L,CLT 02/18/2022, 9:51 AM  [facility] PHYSICAL AND SPORTS MEDICINE [address], [place], [address] Phone: [phone]   Fax:  [phone]  Name: Debbie Newman MRN: [id number] Date of Birth: 08-14-1961

## 2022-03-02 ENCOUNTER — Other Ambulatory Visit: Payer: Self-pay

## 2022-03-02 ENCOUNTER — Ambulatory Visit: Payer: BC Managed Care – PPO | Admitting: Occupational Therapy

## 2022-03-02 DIAGNOSIS — M25631 Stiffness of right wrist, not elsewhere classified: Secondary | ICD-10-CM

## 2022-03-02 DIAGNOSIS — M25531 Pain in right wrist: Secondary | ICD-10-CM

## 2022-03-02 DIAGNOSIS — M6281 Muscle weakness (generalized): Secondary | ICD-10-CM

## 2022-03-02 NOTE — Therapy (Signed)
[place] ?[facility] ?[address] ?[place], [place], [address] ?Phone: [phone]   Fax:  [phone] ? ?Occupational Therapy Treatment ? ?Patient Details  ?Name: Debbie Newman ?MRN: [id number] ?Date of Birth: February 06, 1961 ?Referring Provider (OT): Mikle Bosworth ? ? ?Encounter Date: 03/02/2022 ? ? OT End of Session - 03/02/22 1722   ? ? Visit Number 3   ? Number of Visits 15   ? Date for OT Re-Evaluation 04/13/22   ? OT Start Time 0901   ? OT Stop Time 0940   ? OT Time Calculation (min) 39 min   ? Activity Tolerance Patient tolerated treatment well   ? Behavior During Therapy [facility] for tasks assessed/performed   ? ?  ?  ? ?  ? ? ?Past Medical History:  ?Diagnosis Date  ? Female bladder prolapse 2013  ? Hiatal hernia 2013  ? ? ?Past Surgical History:  ?Procedure Laterality Date  ? [place]  ? CHOLECYSTECTOMY N/A 07/03/2015  ? Procedure: LAPAROSCOPIC CHOLECYSTECTOMY WITH INTRAOPERATIVE CHOLANGIOGRAM;  Surgeon: Leonie Green, MD;  Location: [facility] ORS;  Service: General;  Laterality: N/A;  ? TONSILLECTOMY    ? ? ?There were no vitals filed for this visit. ? ? Subjective Assessment - 03/02/22 0955   ? ? Subjective  Doing okay- pain on thumb side better -pain mostly on my pinkie side when gripping and wrist- but pain now at the most maybe 5/10 - no 7-8 anymore   ? 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 They were last evaluated by Cameron Proud, PA on 01/15/2022. At that time, the plan was to perform a right wrist MRI arthrogram, use a short arm wrist brace, then follow-up after MRI. She had her MRI on 01/27/2022. 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 She was recommended follow-up with reevaluation and consideration of diagnostic/therapeutic injection to help determine her primary pain generator   ? Patient Stated Goals Would like my pain in my wrist to be better to be able to use it more without wearing splint all the time   ? Currently in Pain? Yes   ? Pain Score 3    ? Pain Location Wrist   ? Pain Orientation Right   ? Pain Descriptors / Indicators Aching;Tender   ? Pain Type Chronic pain;Acute pain   ? Pain Onset More than a month ago   ? Pain Frequency Intermittent   ? ?  ?  ? ?  ? ? ? ? ? [facility] OT Assessment - 03/02/22 0001   ? ?  ? AROM  ? Right Forearm Pronation 90 Degrees   ? Right Forearm Supination 90 Degrees   ? Right Wrist Extension 70 Degrees   ? Right Wrist Flexion 90 Degrees   ? Right Wrist Radial Deviation 25 Degrees   ? Right Wrist Ulnar Deviation 40 Degrees   ?  ? Strength  ? Right Hand Grip (lbs) 44   ? Right Hand Lateral Pinch 10 lbs   ? Right Hand 3 Point Pinch 12 lbs   ? Left Hand Grip (lbs) 40   ? Left Hand Lateral Pinch 10 lbs   ?  Left Hand 3 Point Pinch 12 lbs   ? ?  ?  ? ?  ? ? ?  Patient continues to show into sessions increase active range of motion with pain less than 1-3 out of 10 ?Patient reports less pain on radial wrist and dorsal wrist mostly now on ulnar wrist into the hypothenar eminence with grip ?Tenderness over fifth metacarpal and palpation at ulnar wrist ?Continues to wear splint with most all activity about 85% of time ?Patient want to avoid surgery patient request OT to ask for iontophoresis with dexamethasone order ?We will try for 4 sessions to see if can decrease ulnar wrist pain ? ? ? ? ? ? ? ? OT Treatments/Exercises (OP) - 03/02/22 0001   ? ?  ? RUE Contrast [place]  ? Time 8 minutes   ? Comments prior to AROM   ? ?  ?  ? ?  ? ? ?Pt to do at home contrast - prior to [facility] for wrist in all planes - pain free  ?Wrist splint  heavy act- and Benik neoprene on L  ?Ed on and hand out review on joint protection principles last time -and reinforce again  ?Soft tissue mobs to [facility] and CT spreads and webspace  ? Joint mobs - traction and compression to wrist - no pain  ?Tender over FCU and TFCC - and 5th MC -  less pain with AROM and gripping reported on radial wrist  ?AAROM and AROM for wrist in all planes - pain free range  ?  ? ? ? ? ? ? OT Education - 03/02/22 1722   ? ? Education Details progress and HEP   ? Person(s) Educated Patient   ? Methods Explanation;Demonstration;Tactile cues;Verbal cues;Handout   ? Comprehension Verbal cues required;Returned demonstration;Verbalized understanding   ? ?  ?  ? ?  ? ? ? ? ? ? OT Long Term Goals - 03/02/22 1728   ? ?  ? OT LONG TERM GOAL #1  ? Title Pt to be independent in HEP to increase  R wrist AROM to WNL to wear splint 50% of time and pain less than 2/10   ? Baseline wrist splint more than 85% of time - pain 2-8/10 and decrease wrist AROM in all planes  STill wearing splint most all time , increase ROM - but pain still 1-3/10 with AROM -shooting pain at times   ? Time 4   ? Period Weeks   ? Status On-going   ? Target Date 03/30/22   ?  ? OT LONG TERM GOAL #2  ? Title Pt to verbalize and demo 3 joint protection or modifications /AE to use to decrease pain in R wrist and ease of doing tasks in ADL's and IADL's   ? Status Achieved   ?  ? OT LONG TERM GOAL #3  ? Title Pt show show increase grip and prenhension by 1-3 lbs without pain to wear splint less than 50% of time or more wrist wrap   ? Baseline 85% or more in wrist splint -pain 2-8/10 with use - grip R 44,L 45 ,  3 point 11 lbs , lat grip R 11 , L 10 lbs  NOW pain still 2-3/10 assessment better, splint same wearing , grip and prehension same   ? Time 6   ? Period Weeks   ? Status On-going   ? Target Date 04/13/22   ? ?  ?  ? ?  ? ? ? ? ? ? ? ?  Plan - 03/02/22 1723   ? ? Clinical Impression Statement Pt report having pain in R wrist for a while  and wearing wrist splint since Nov 2022 - She had her MRI on 01/27/2022. 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 She had on 02/01/22 diagnostic/therapeutic injection to help determine her primary pain generator. Pt also refer to OT to decrease pain and increase AROM and strength for functional use-  NOW since eval she showed increase AROM at wrist to WNL  with pain less than 3/10 - pain on radial wrist and dorsal wrist better- mostly limited on ulnar side of hand and wrist. No popping today at supination end range. Cont not to have pain with making fist or with thumb PA and RA as well as opposition. Grip and prehension  on the lower range for her age.But prehension in range.  Pain stayed below 3/10 in session. Pt ed on HEP for increasing AROM for wrist without increasing pain, use of wrist splint with heavy activities cont same HEP. Pt would want to avoid surgery because of recovery time- request order for Ionto with dexamethazone to use on ulnar wrist . Pt to keep pain under 2/10 - at eval ed and reviewed hand out about joint protection  principles and modifications - pt to cont at home with homeprogram and follow up in 10 days.   ? OT Occupational Profile and History Problem Focused Assessment - Including review of records relating to presenting problem   ? Occupational performance deficits (Please refer to evaluation for details): ADL's;IADL's;Work;Play;Leisure;Social Participation   ? Body Structure / Function / Physical Skills ADL;Decreased knowledge of use of DME;Strength;Pain;UE functional use;ROM;IADL;Flexibility   ? Rehab Potential Good   ? Clinical Decision Making Limited treatment options, no task modification necessary   ? Comorbidities Affecting Occupational Performance: May have  comorbidities impacting occupational performance   ? Modification or Assistance to Complete Evaluation  No modification of tasks or assist necessary to complete eval   ? OT Frequency 2x / week   ? OT Duration 6 weeks

## 2022-03-04 ENCOUNTER — Ambulatory Visit: Payer: BC Managed Care – PPO | Admitting: Physical Therapy

## 2022-03-04 ENCOUNTER — Encounter: Payer: Self-pay | Admitting: Physical Therapy

## 2022-03-04 ENCOUNTER — Other Ambulatory Visit: Payer: Self-pay

## 2022-03-04 DIAGNOSIS — M25531 Pain in right wrist: Secondary | ICD-10-CM | POA: Diagnosis not present

## 2022-03-04 NOTE — Patient Instructions (Signed)
?  Proper body mechanics with getting out of a chair to decrease strain  ?on back &pelvic floor  ? ?Avoid holding your breath when ?Getting out of the chair: ? ?Scoot to front part of chair chair ?Heels behind knees, feet are hip width apart, nose over toes  ?Inhale like you are smelling roses ?Exhale to stand  ? ? ?__ ? ? ?Avoid straining pelvic floor, abdominal muscles , spine  ?Use log rolling technique instead of getting out of bed with your neck or the sit-up  ? ? ? ?Log rolling into and out of bed ? ? ?Log rolling into and out of bed ?If getting out of bed on R side, ?Bent knees, scoot hips/ shoulder to L  ?Raise R arm completely overhead, rolling onto armpit  ?Then lower bent knees to bed to get into complete side lying position  ?Then drop legs off bed, and push up onto R elbow/forearm, and use L hand to push onto the bed ? ?___ ? ? ?Lengthen Back rib by R  shoulder  ?  ?Lie on L  side , pillow between knees and under head  ?Pull  \hand on shoulder, drawing elbow away in circles from ears  ?Breathing ?10 reps ? ?Open book (handout)  ?Lying on  _ side , rotating  __ only this week  ?Rotating onto pillow /yoga block  ?Pillow/ Block between knees  ?10 reps ? ? ?

## 2022-03-04 NOTE — Therapy (Signed)
[place] ?[facility] ?[address] ?[place], [place], [address] ?Phone: [phone]   Fax:  [phone] ? ?Physical Therapy Evaluation ? ?Patient Details  ?Name: Debbie Newman ?MRN: [id number] ?Date of Birth: 11/18/1961 ?Referring Provider (PT): Dalbert GarnetBeasley ? ? ?Encounter Date: 03/04/2022 ? ? PT End of Session - 03/04/22 0810   ? ? Visit Number 1   ? Number of Visits 10   ? Date for PT Re-Evaluation 05/13/22   ? PT Start Time [phone]   ? PT Stop Time 0900   ? PT Time Calculation (min) 56 min   ? Activity Tolerance Patient tolerated treatment well   ? ?  ?  ? ?  ? ? ?Past Medical History:  ?Diagnosis Date  ? Female bladder prolapse 2013  ? Hiatal hernia 2013  ? ? ?Past Surgical History:  ?Procedure Laterality Date  ? CESAREAN SECTION  1999  ? CHOLECYSTECTOMY N/A 07/03/2015  ? Procedure: LAPAROSCOPIC CHOLECYSTECTOMY WITH INTRAOPERATIVE CHOLANGIOGRAM;  Surgeon: Nadeen LandauJarvis Wilton Smith, MD;  Location: [facility] ORS;  Service: General;  Laterality: N/A;  ? TONSILLECTOMY    ? ? ?There were no vitals filed for this visit. ? ? ? Subjective Assessment - 03/04/22 0810   ? ? Subjective 1) Prolapse: Pt noticed her prolapse 12 years ago.  Currently, pt feels it is there but it is not hanging all the way out. With urination, it takes time to get started and she does not feel emptied completely. Sometimes when she has bowel movments, it pushing it down but she does not strain. Bowel movements occur daily last week but sometimes it occurs evey other day. Stool consistency Type 4 across 75% of the time. Pt drinks 56 floz of water, 48 fl of sweetened  tea. 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  Pt in the past had to lift a patient but not currently. Pt performs chores for the patient.   Pt has a pessary and does not use it because it makes her bleed.    ? ?2) CLBP with radiating pain down L LE to the back of the knee. It occurs randomly. 3/10. Easing factors : sitting down.   3) R knee  pain occurs constanly. Occurs with walking sometimes. Biking does not hurt the knee as much. No pain with stairs.  ?  ? Pertinent History C section x 1, Vaginal deliveries x 2 , gall bladder removal.  Fitness : rides a stationery bike 15 min, stretching routine before riding,   ? Patient Stated Goals lift everything up and get it up to where it goes and prevent surgery.   ? ?  ?  ? ?  ? ? ? ? ? [facility] PT Assessment - 03/04/22 0831   ? ?  ? Assessment  ? Medical Diagnosis prolapse   ? Referring Provider (PT) Dalbert GarnetBeasley   ?  ? Precautions  ? Precautions None   ?  ? Restrictions  ? Weight Bearing Restrictions No   ?  ? Balance Screen  ? Has the patient fallen in the past 6 months No   ?  ? Coordination  ? Coordination and Movement Description chest breathing   ?  ? Posture/Postural Control  ? Posture Comments narrow BOS   ?  ? Strength  ? Overall Strength Comments BLE and hip abd  3+/5   ?  ? Palpation  ? Spinal mobility no reproduction of pain with 4 directions of spine   ?  SI assessment  R ASIS more posteriorly rotated   ? Palpation comment increased C section scar restrictions   ?  ? Ambulation/Gait  ? Gait Comments limited L posterior rotation of pelvic girdle ,   ? ?  ?  ? ?  ? ? ? ? ? ? ? ? ? ? ? ? ? ?Objective measurements completed on examination: See above findings.  ? ? ? Pelvic Floor Special Questions - 03/04/22 1345   ? ? Diastasis Recti neg   ? ?  ?  ? ?  ? ? ? [facility] Adult PT Treatment/Exercise - 03/04/22 0831   ? ?  ? Ambulation/Gait  ? Gait velocity pre Tx: 1.06 m/s , post Tx 1.16 m/s   ?  ? Neuro Re-ed   ? Neuro Re-ed Details  cued for body mechanics to minimzie restraining of pelvic floor   ?  ? Manual Therapy  ? Manual therapy comments STM/MWM at thoracic spine to promote more L upper medial scapula/ intercostal tightness/ paraspinal   ? ?  ?  ? ?  ? ? ? ? ? ? ? ? ? ? ? ? ? ? ? PT Long Term Goals - 03/04/22 1327   ? ?  ? PT LONG TERM GOAL #1  ? Title Pt will demo proper coordination of deep core  coordination promote improved position of bladder and to urinate with less difficulty   ? Time 4   ? Period Weeks   ? Status New   ? Target Date 04/01/22   ?  ? PT LONG TERM GOAL #2  ? Title Pt will demo levelled pelvic and shoulder alignment ( R side lowered) and improved recpirocal gait pattern in order to advance to deep core exericses and decrease CLBP   ? Time 2   ? Period Weeks   ? Status New   ? Target Date 03/18/22   ?  ? PT LONG TERM GOAL #3  ? Title Pt will improve FOTO score for LBP score will have  5 pt change  ( 59pt ) in order to demo improved functional mobility   ? Time 10   ? Period Weeks   ? Status New   ? Target Date 05/13/22   ?  ? PT LONG TERM GOAL #4  ? Title Pt will report completing urination 100% of the time in order to minimize UTI and promote hygience   ? Time 8   ? Period Weeks   ? Target Date 04/29/22   ? ?  ?  ? ?  ? ? ? ? ? ? ? ? ? Plan - 03/04/22 1320   ? ? Clinical Impression Statement  ?Pt is a  61  yo  who presents with prolapse which impact her ability with uriantion and bowel movements. And pt also complains of CLBP with radiating pain down L posterior midthigh.   ? ?Pt's musculoskeletal assessment revealed lowered R shoulder and iliac crest which impact her gait pattern. L sciatic pain was not reproduced today with assessments. Pt had increased C-section scar restrictions which impact her prolapse and pelvic floor issues. Pt also demo'd poor body mechanics which places strain on the abdominal/pelvic floor mm.  ? ?These are deficits that indicate an ineffective intraabdominal pressure system associated with increased risk for pt's Sx. Pt will benefit from coordination training and education onfunctional positions in order to gain a more effective intraabdominal pressure system for improving her prolapse related Sx and LBP.  ? ?Pt  was provided education on etiology of Sx with anatomy, physiology explanation with images along with the benefits of customized pelvic PT Tx based on  pt's medical conditions and musculoskeletal deficits.  Explained the physiology of deep core mm coordination and roles of pelvic floor function in urination, defecation, sexual function, and postural control with deep core mm system.  ? ?Following Tx today which pt tolerated without complaints, pt demo'd equal alignment of pelvic girdle and increased spinal mobility. Her gait speed improved.Anticipate today's Tx will help with LBP and will help yield better outcomes for pelvic issues.  Plan to assess pelvic floor at upcoming sessions. Pt continues to benefit from skilled PT. ?  ? Examination-Activity Limitations Toileting;Locomotion Level   ? Stability/Clinical Decision Making Evolving/Moderate complexity   ? Clinical Decision Making Moderate   ? Rehab Potential Good   ? PT Frequency 1x / week   ? PT Duration Other (comment)   10  ? PT Treatment/Interventions Gait training;Stair training;Functional mobility training;Therapeutic activities;Patient/family education;Therapeutic exercise;Manual techniques;Neuromuscular re-education;Balance training;Scar mobilization;Taping;Cryotherapy;ADLs/Self Care Home Management;Moist Heat;Dry needling   ? Consulted and Agree with Plan of Care Patient   ? ?  ?  ? ?  ? ? ?Patient will benefit from skilled therapeutic intervention in order to improve the following deficits and impairments:  Decreased activity tolerance, Decreased coordination, Decreased endurance, Decreased strength, Decreased range of motion, Impaired flexibility, Hypomobility, Decreased scar mobility, Decreased mobility, Abnormal gait, Increased muscle spasms, Increased fascial restricitons, Improper body mechanics, Pain, Postural dysfunction, Decreased knowledge of use of DME, Decreased balance ? ?Visit Diagnosis: ?Other abnormalities of gait and mobility ? ?Chronic bilateral low back pain with left-sided sciatica ? ?Other lack of coordination ? ?Chronic pain of right knee ? ? ? ? ?Problem List ?There are no  problems to display for this patient. ? ? ?Mariane Masters, PT ?03/04/2022, 1:46 PM ? ?[place] ?[facility] ?[address] ?[place], [place], [address] ?Phone: [phone]

## 2022-03-09 ENCOUNTER — Other Ambulatory Visit: Payer: Self-pay

## 2022-03-09 ENCOUNTER — Encounter: Payer: Self-pay | Admitting: Occupational Therapy

## 2022-03-09 ENCOUNTER — Ambulatory Visit: Payer: BC Managed Care – PPO | Admitting: Occupational Therapy

## 2022-03-09 DIAGNOSIS — M25531 Pain in right wrist: Secondary | ICD-10-CM | POA: Diagnosis not present

## 2022-03-09 DIAGNOSIS — M25631 Stiffness of right wrist, not elsewhere classified: Secondary | ICD-10-CM

## 2022-03-09 DIAGNOSIS — M6281 Muscle weakness (generalized): Secondary | ICD-10-CM

## 2022-03-09 NOTE — Therapy (Signed)
[place] ?[facility] ?[address] ?[place], [place], [address] ?Phone: [phone]   Fax:  [phone] ? ?Occupational Therapy Treatment ? ?Patient Details  ?Name: Debbie Newman ?MRN: [id number] ?Date of Birth: 11/11/1961 ?Referring Provider (OT): Marney Doctor ? ? ?Encounter Date: 03/09/2022 ? ? OT End of Session - 03/09/22 1531   ? ? Visit Number 4   ? Number of Visits 15   ? Date for OT Re-Evaluation 04/13/22   ? OT Start Time 1530   ? OT Stop Time 1610   ? OT Time Calculation (min) 40 min   ? Activity Tolerance Patient tolerated treatment well   ? Behavior During Therapy [facility] for tasks assessed/performed   ? ?  ?  ? ?  ? ? ?Past Medical History:  ?Diagnosis Date  ? Female bladder prolapse 2013  ? Hiatal hernia 2013  ? ? ?Past Surgical History:  ?Procedure Laterality Date  ? CESAREAN SECTION  1999  ? CHOLECYSTECTOMY N/A 07/03/2015  ? Procedure: LAPAROSCOPIC CHOLECYSTECTOMY WITH INTRAOPERATIVE CHOLANGIOGRAM;  Surgeon: Nadeen Landau, MD;  Location: [facility] ORS;  Service: General;  Laterality: N/A;  ? TONSILLECTOMY    ? ? ?There were no vitals filed for this visit. ? ? Subjective Assessment - 03/09/22 1531   ? ? Subjective  My wrist was bothering me more today at the thumb side-apparent 3 out of 10 pain, I did something to the back of my hand burned it or scratched it little tender   ? 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 They were last evaluated by Horris Latino, PA on 01/15/2022. At that time, the plan was to perform a right wrist MRI arthrogram, use a short arm wrist brace, then follow-up after MRI. She had her MRI on 01/27/2022. 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 She was recommended follow-up with reevaluation and consideration of diagnostic/therapeutic injection to help determine her primary pain generator   ? Patient Stated Goals Would like my pain in my wrist to be better to be able to use it more without wearing splint all the time   ? Currently in Pain? Yes   ? Pain Score 3    ? Pain Location Wrist   ? Pain Orientation Right   ? Pain Descriptors / Indicators Aching;Tender   ? Pain Type Chronic pain   ? Pain Onset More than a month ago   ? ?  ?  ? ?  ? ? ? ? ?  Patient continues to show increase active range of motion with pain less than 1-3 out of 10 ?Patient reports less pain on radial wrist  last time and dorsal wrist, ulnar side last time  ?But today radial wrist pain and tenderness  ?But no pain or tenderness on ulnar wrist  ?Tenderness over fifth metacarpal and palpation at ulnar wrist better today ?Continues to wear splint with most all activity about 85% of time ?Patient want to avoid surgery patient request OT to ask for iontophoresis with dexamethasone order ?  ?  ?  ?Pt to do at home contrast - prior to [facility] for wrist in all planes - pain free  ?Wrist splint  heavy act- and Benik neoprene on L  ?Ed on and hand out review on joint protection principles last time -and reinforce again  ?Soft tissue mobs to [facility] and CT spreads and webspace  ? Joint mobs - traction and compression to wrist - no pain  ?AAROM and AROM for wrist pain free range  ?  ?  ?  ?  ?  ?  ?  ?  ?  ?  ?  ? ? ? ? ? ?skin check done and pt ed on use - tolerated well - remove patch - skin check done prior and afterwards  ? ? OT Treatments/Exercises (OP) - 03/09/22 0001   ? ?  ? Iontophoresis  ? Type of Iontophoresis Dexamethasone   ? Location radial wrist /FCU   ? Dose med patch, 2.0 current   ? Time 19   ? ?  ?  ? ?  ? ? ? ? ? ? ? ? ? OT Education - 03/09/22 1531   ? ? Education Details progress and HEP    ? Person(s) Educated Patient   ? Methods Explanation;Demonstration;Tactile cues;Verbal cues;Handout   ? Comprehension Verbal cues required;Returned demonstration;Verbalized understanding   ? ?  ?  ? ?  ? ? ? ? ? ? OT Long Term Goals - 03/02/22 1728   ? ?  ? OT LONG TERM GOAL #1  ? Title Pt to be independent in HEP to increase  R wrist AROM to WNL to wear splint 50% of time and pain less than 2/10   ? Baseline wrist splint more than 85% of time - pain 2-8/10 and decrease wrist AROM in all planes  STill wearing splint most all time , increase ROM - but pain still 1-3/10 with AROM -shooting pain at times   ? Time 4   ? Period Weeks   ? Status On-going   ? Target Date 03/30/22   ?  ? OT LONG TERM GOAL #2  ? Title Pt to verbalize and demo 3 joint protection or modifications /AE to use to decrease pain in R wrist and ease of doing tasks in ADL's and IADL's   ? Status Achieved   ?  ? OT LONG TERM GOAL #3  ? Title Pt show show increase grip and prenhension by 1-3 lbs without pain to wear splint less than 50% of time or more wrist wrap   ? Baseline 85% or more in wrist splint -pain 2-8/10 with use - grip R 44,L 45 ,  3 point 11 lbs , lat grip R 11 , L 10 lbs  NOW pain still 2-3/10 assessment better, splint same wearing , grip and prehension same   ? Time 6   ? Period Weeks   ? Status On-going   ? Target Date 04/13/22   ? ?  ?  ? ?  ? ? ? ? ? ? ? ? Plan - 03/09/22 1531   ? ? Clinical Impression Statement Pt report having pain in R wrist for a while and wearing wrist splint since Nov 2022 - She had her MRI on 01/27/2022. 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 She had on 02/01/22 diagnostic/therapeutic injection to help determine her primary pain generator. Pt also refer to OT to decrease pain and increase  AROM and strength for functional use-  NOW since  eval she showed increase AROM at wrist to WNL  with pain less than 3/10 - pain on  dorsal wrist better- mostly limited on ulnar side of hand and wrist last visit but today good and no tenderenss. Tenderness and pain on radial wrist /FCU?Marland Kitchen. . No popping today at supination end range and no pain. Cont pain free with making fist and thumb PA and RA as well as opposition. Grip and prehension  on the lower range for her age.But prehension in range.  Pain stayed below 3/10 in session. Pt ed on HEP for increasing AROM for wrist without increasing pain, use of wrist splint with heavy activities cont same HEP. Pt would want to avoid surgery because of recovery time-initiated ionto with dexamethazone to use radial wrist today . Pt to keep pain under 2/10 - at eval ed and reviewed hand out about joint protection  principles and modifications - pt to cont at home with homeprogram.   ? OT Occupational Profile and History Problem Focused Assessment - Including review of records relating to presenting problem   ? Occupational performance deficits (Please refer to evaluation for details): ADL's;IADL's;Work;Play;Leisure;Social Participation   ? Body Structure / Function / Physical Skills ADL;Decreased knowledge of use of DME;Strength;Pain;UE functional use;ROM;IADL;Flexibility   ? Rehab Potential Good   ? Clinical Decision Making Limited treatment options, no task modification necessary   ? Comorbidities Affecting Occupational Performance: May have comorbidities impacting occupational performance   ? Modification or Assistance to Complete Evaluation  No modification of tasks or assist necessary to complete eval   ? OT Frequency 2x / week   ? OT Duration 6 weeks   ? OT Treatment/Interventions Self-care/ADL training;Paraffin;Fluidtherapy;Contrast Bath;Therapeutic exercise;Patient/family education;Manual Therapy;Passive range of motion;Splinting;DME and/or AE instruction;Iontophoresis   ?  Consulted and Agree with Plan of Care Patient   ? ?  ?  ? ?  ? ? ?Patient will benefit from skilled therapeutic intervention in order to improve the following deficits and impairments:   ?Body Struc

## 2022-03-11 ENCOUNTER — Other Ambulatory Visit: Payer: Self-pay

## 2022-03-11 ENCOUNTER — Ambulatory Visit: Payer: BC Managed Care – PPO | Admitting: Physical Therapy

## 2022-03-11 ENCOUNTER — Ambulatory Visit: Payer: BC Managed Care – PPO | Admitting: Occupational Therapy

## 2022-03-11 DIAGNOSIS — M25531 Pain in right wrist: Secondary | ICD-10-CM | POA: Diagnosis not present

## 2022-03-11 DIAGNOSIS — G8929 Other chronic pain: Secondary | ICD-10-CM

## 2022-03-11 DIAGNOSIS — M6281 Muscle weakness (generalized): Secondary | ICD-10-CM

## 2022-03-11 DIAGNOSIS — R2689 Other abnormalities of gait and mobility: Secondary | ICD-10-CM

## 2022-03-11 DIAGNOSIS — M25631 Stiffness of right wrist, not elsewhere classified: Secondary | ICD-10-CM

## 2022-03-11 DIAGNOSIS — R278 Other lack of coordination: Secondary | ICD-10-CM

## 2022-03-11 NOTE — Therapy (Signed)
[place] ?[facility] ?[address] ?[place], [place], [address] ?Phone: [phone]   Fax:  [phone] ? ?Occupational Therapy Treatment ? ?Patient Details  ?Name: Debbie Newman ?MRN: [id number] ?Date of Birth: 30-Jul-1961 ?Referring Provider (OT): Marney DoctorMcGhee ? ? ?Encounter Date: 03/11/2022 ? ? OT End of Session - 03/11/22 1733   ? ? Visit Number 5   ? Number of Visits 15   ? Date for OT Re-Evaluation 04/13/22   ? OT Start Time 1530   ? OT Stop Time 1606   ? OT Time Calculation (min) 36 min   ? Activity Tolerance Patient tolerated treatment well   ? Behavior [facility] for tasks assessed/performed   ? ?  ?  ? ?  ? ? ?Past Medical History:  ?Diagnosis Date  ? Female bladder prolapse 2013  ? Hiatal hernia 2013  ? ? ?Past Surgical History:  ?Procedure Laterality Date  ? CESAREAN SECTION  1999  ? CHOLECYSTECTOMY N/A 07/03/2015  ? Procedure: LAPAROSCOPIC CHOLECYSTECTOMY WITH INTRAOPERATIVE CHOLANGIOGRAM;  Surgeon: Nadeen LandauJarvis Wilton Smith, MD;  Location: [facility] ORS;  Service: General;  Laterality: N/A;  ? TONSILLECTOMY    ? ? ?There were no vitals filed for this visit. ? ? Subjective Assessment - 03/11/22 1706   ? ? Subjective  My wrist hurts depending on what I do the day - had to turn the matress today - pain mostly still on the thumb side this week   ? 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 They were last evaluated by Horris LatinoLance McGhee, PA on 01/15/2022. At that time, the plan was to perform a right wrist MRI arthrogram, use a short arm wrist brace, then follow-up after MRI. She had her MRI on 01/27/2022. 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 She was recommended follow-up with reevaluation and consideration of diagnostic/therapeutic injection to help determine her primary pain generator   ? Patient Stated Goals Would like my pain in my wrist to be better to be able to use it more without wearing splint all the time   ? Currently in Pain? Yes   ? Pain Score 2    ? Pain Location Wrist   ? Pain Orientation --   Radial  ? Pain Descriptors / Indicators Tender;Aching   ? Pain Type Chronic pain   ? ?  ?  ? ?  ? ? ? ? ? ? ?Patient continues to show increase active range of motion with pain less than 1-2 out of 10 ?Patient reports less pain on radial wrist  last week and dorsal wrist, ulnar side pain ?But this week radial wrist pain and tenderness  ?But no pain or tenderness on ulnar wrist  ?Tenderness over fifth metacarpal and palpation at ulnar wrist better today ?Continues to wear splint with most all activity about 85% of time ?Patient want to avoid surgery patient request OT to ask for iontophoresis with dexamethasone order ?  ?  ?  ?Pt to do at [facility] contrast [facility] - pain free  ?Wrist splint heavy act- and Benik neoprene on L  ?Ed on and hand out review  on joint protection principles last time -and reinforce again  ?Soft tissue mobs to [facility] and CT spreads and webspace  ? Joint mobs - traction and compression to wrist - no pain  ?AAROM and AROM for wrist pain free range  ?  ?  ?  ?  ?  ?  ?  ?  ?  ?  ?  ?  ?  ?  ?  ?  ?skin check done and pt ed on use - tolerated well - remove patch - skin check done prior and afterwards  ?  ? ?Ionto done - med patch at 2.0 current and 19 min  ?Over radial wrist  ? ? ? ? ? ? ? ? ? ? ? ? ? ? OT Education - 03/11/22 1733   ? ? Education Details progress and HEP   ? Person(s) Educated Patient   ? Methods Explanation;Demonstration;Tactile cues;Verbal cues;Handout   ? Comprehension Verbal cues required;Returned  demonstration;Verbalized understanding   ? ?  ?  ? ?  ? ? ? ? ? ? OT Long Term Goals - 03/02/22 1728   ? ?  ? OT LONG TERM GOAL #1  ? Title Pt to be independent in HEP to increase  R wrist AROM to WNL to wear splint 50% of time and pain less than 2/10   ? Baseline wrist splint more than 85% of time - pain 2-8/10 and decrease wrist AROM in all planes  STill wearing splint most all time , increase ROM - but pain still 1-3/10 with AROM -shooting pain at times   ? Time 4   ? Period Weeks   ? Status On-going   ? Target Date 03/30/22   ?  ? OT LONG TERM GOAL #2  ? Title Pt to verbalize and demo 3 joint protection or modifications /AE to use to decrease pain in R wrist and ease of doing tasks in ADL's and IADL's   ? Status Achieved   ?  ? OT LONG TERM GOAL #3  ? Title Pt show show increase grip and prenhension by 1-3 lbs without pain to wear splint less than 50% of time or more wrist wrap   ? Baseline 85% or more in wrist splint -pain 2-8/10 with use - grip R 44,L 45 ,  3 point 11 lbs , lat grip R 11 , L 10 lbs  NOW pain still 2-3/10 assessment better, splint same wearing , grip and prehension same   ? Time 6   ? Period Weeks   ? Status On-going   ? Target Date 04/13/22   ? ?  ?  ? ?  ? ? ? ? ? ? ? ? Plan - 03/11/22 1739   ? ? Clinical Impression Statement Pt report having pain in R wrist for a while and wearing wrist splint since Nov 2022 - She had her MRI on 01/27/2022. 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 She had on 02/01/22 diagnostic/therapeutic injection to help determine her primary pain generator. Pt also refer to OT to decrease pain and increase AROM and strength for functional use-  NOW since eval she showed increase AROM at wrist to WNL  with pain less than 3/10 - pain on  dorsal wrist  better- mostly limited on ulnar side of hand and wrist last visit but today good  and no tenderenss. Tenderness and pain on radial wrist /FCU? again today. . No popping today at supination end range and no pain. Cont pain free with making fist and thumb PA and RA as well as opposition. Grip and prehension  on the lower range for her age.But prehension in range.  Pain stayed below 2/10 in session. Pt ed on HEP for increasing AROM for wrist without increasing pain, use of wrist splint with heavy activities cont same HEP. Pt would want to avoid surgery because of recovery time-initiated ionto with dexamethazone this week- 2nd session use radial wrist today . Pt to keep pain under 2/10 - at eval ed and reviewed hand out about joint protection  principles and modifications - pt to cont at home with homeprogram.   ? OT Occupational Profile and History Problem Focused Assessment - Including review of records relating to presenting problem   ? Occupational performance deficits (Please refer to evaluation for details): ADL's;IADL's;Work;Play;Leisure;Social Participation   ? Body Structure / Function / Physical Skills ADL;Decreased knowledge of use of DME;Strength;Pain;UE functional use;ROM;IADL;Flexibility   ? Rehab Potential Good   ? Clinical Decision Making Limited treatment options, no task modification necessary   ? Comorbidities Affecting Occupational Performance: May have comorbidities impacting occupational performance   ? Modification or Assistance to Complete Evaluation  No modification of tasks or assist necessary to complete eval   ? OT Frequency 2x / week   ? OT Duration 6 weeks   ? OT Treatment/Interventions Self-care/ADL training;Paraffin;Fluidtherapy;Contrast Bath;Therapeutic exercise;Patient/family education;Manual Therapy;Passive range of motion;Splinting;DME and/or AE instruction;Iontophoresis   ? Consulted and Agree with Plan of Care Patient   ? ?  ?  ? ?  ? ? ?Patient will benefit from skilled therapeutic  intervention in order to improve the following deficits and impairments:   ?Body Structure / Function / Physical Skills: ADL, Decreased knowledge of use of DME, Strength, Pain, UE functional use, ROM, IADL, Flexibility ?  ?  ? ? ?V

## 2022-03-11 NOTE — Therapy (Signed)
[place] ?[facility] ?[address] ?[place], [place], [address] ?Phone: [phone]   Fax:  [phone] ? ?Physical Therapy Treatment ? ?Patient Details  ?Name: Debbie Newman ?MRN: [id number] ?Date of Birth: Feb 18, 1961 ?Referring Provider (PT): Dalbert Garnet ? ? ?Encounter Date: 03/11/2022 ? ? PT End of Session - 03/11/22 1202   ? ? Visit Number 2   ? Number of Visits 10   ? Date for PT Re-Evaluation 05/13/22   ? PT Start Time 1100   ? PT Stop Time 1203   ? PT Time Calculation (min) 63 min   ? Activity Tolerance Patient tolerated treatment well   ? ?  ?  ? ?  ? ? ?Past Medical History:  ?Diagnosis Date  ? Female bladder prolapse 2013  ? Hiatal hernia 2013  ? ? ?Past Surgical History:  ?Procedure Laterality Date  ? CESAREAN SECTION  1999  ? CHOLECYSTECTOMY N/A 07/03/2015  ? Procedure: LAPAROSCOPIC CHOLECYSTECTOMY WITH INTRAOPERATIVE CHOLANGIOGRAM;  Surgeon: Nadeen Landau, MD;  Location: [facility] OR[facility];  Service: General;  Laterality: N/A;  ? TONSILLECTOMY    ? ? ?There were no vitals filed for this visit. ? ? Subjective Assessment - 03/11/22 1106   ? ? Subjective Pt has been practicing all the techniques from last session.   ? Pertinent History C section x 1, Vaginal deliveries x 2 , gall bladder removal.  Fitness : rides a stationery bike 15 min, stretching routine before riding,   ? Patient Stated Goals lift everything up and get it up to where it goes and prevent surgery.   ? ?  ?  ? ?  ? ? ? ? ? [facility] PT Assessment - 03/11/22 1107   ? ?  ? AROM  ? Overall AROM Comments L hip abd/ER less flexibility compared to R ( post Tx: improved mobility)   ?  ? Palpation  ? SI assessment  significant hypomobile L SIJ, glut minimus region tenderness,   ? Palpation comment tight C section scars, tightness and tenderness at L pubic symphysis attachments  tightness at medial knee,   ?  ? Ambulation/Gait  ? Gait velocity 1.46 m/s   ? Gait Comments reciporcal pattern   ? ?  ?  ? ?   ? ? ? ? ? ? ? ? ? ? ? ? ? ? ? ? [facility] Adult PT Treatment/Exercise - 03/11/22 1107   ? ?  ? Neuro Re-ed   ? Neuro Re-ed Details  cued for SIJ STRETCH, deep core coordination   ?  ? Modalities  ? Modalities Moist Heat   ?  ? Moist Heat Therapy  ? Number Minutes Moist Heat 5 Minutes   ? Moist Heat Location --   L hip  ( supine reclined twist to decrease tensions)  ?  ? Manual Therapy  ? Manual therapy comments gentle fascial release with MWM over C section, long axis distraction on LLE, rotational mob on L/ STM/MWM to promote L SIJ mobility, l medial knee/PUBIC SYMPHYSIS   ? ?  ?  ? ?  ? ? ? ? ? ? ? ? ? ? ? ? ? ? ? PT Long Term Goals - 03/04/22 1327   ? ?  ? PT LONG TERM GOAL #1  ? Title Pt will demo proper coordination of deep core coordination promote improved position of bladder and to urinate with less difficulty   ? Time 4   ? Period Weeks   ? Status  New   ? Target Date 04/01/22   ?  ? PT LONG TERM GOAL #2  ? Title Pt will demo levelled pelvic and shoulder alignment ( R side lowered) and improved recpirocal gait pattern in order to advance to deep core exericses and decrease CLBP   ? Time 2   ? Period Weeks   ? Status New   ? Target Date 03/18/22   ?  ? PT LONG TERM GOAL #3  ? Title Pt will improve FOTO score for LBP score will have  5 pt change  ( 59pt ) in order to demo improved functional mobility   ? Time 10   ? Period Weeks   ? Status New   ? Target Date 05/13/22   ?  ? PT LONG TERM GOAL #4  ? Title Pt will report completing urination 100% of the time in order to minimize UTI and promote hygience   ? Time 8   ? Period Weeks   ? Target Date 04/29/22   ? ?  ?  ? ?  ? ? ? ? ? ? ? ? Plan - 03/11/22 1203   ? ? Clinical Impression Statement Pt demo'd more reciprocal gait patern with better alignment of pelvis and spine. Gait speed increased. ? ?Addressed C section restrictions with manual Tx which helped with abdominopelvic mobility. Also addressed L SIJ hypomobility with modification to technqiue to decrease pain  and tenderness. Pt demo'd increased ROM with hip abduction/ ER on L post Tx. Initiated deep core coordination. Pt continues to benefit from skilled PT. Plan to assess pelvic floor next session  ? Examination-Activity Limitations Toileting;Locomotion Level   ? Stability/Clinical Decision Making Evolving/Moderate complexity   ? Rehab Potential Good   ? PT Frequency 1x / week   ? PT Duration Other (comment)   10  ? PT Treatment/Interventions Gait training;Stair training;Functional mobility training;Therapeutic activities;Patient/family education;Therapeutic exercise;Manual techniques;Neuromuscular re-education;Balance training;Scar mobilization;Taping;Cryotherapy;ADLs/Self Care Home Management;Moist Heat;Dry needling   ? Consulted and Agree with Plan of Care Patient   ? ?  ?  ? ?  ? ? ?Patient will benefit from skilled therapeutic intervention in order to improve the following deficits and impairments:  Decreased activity tolerance, Decreased coordination, Decreased endurance, Decreased strength, Decreased range of motion, Impaired flexibility, Hypomobility, Decreased scar mobility, Decreased mobility, Abnormal gait, Increased muscle spasms, Increased fascial restricitons, Improper body mechanics, Pain, Postural dysfunction, Decreased knowledge of use of DME, Decreased balance ? ?Visit Diagnosis: ?Chronic bilateral low back pain with left-sided sciatica ? ?Other abnormalities of gait and mobility ? ?Other lack of coordination ? ? ? ? ?Problem List ?There are no problems to display for this patient. ? ? ?Debbie Newman, PT ?03/11/2022, 12:18 PM ? ?[place] ?[facility] ?[address] ?[place], [place], [address] ?Phone: [phone]   Fax:  [phone] ? ?Name: Debbie Newman ?MRN: [id number] ?Date of Birth: 04/03/1961 ? ? ? ?

## 2022-03-11 NOTE — Patient Instructions (Addendum)
?   Side of hip stretch: ? ?Reclined twist for hips and side of the hips/ legs ? ?Lay on your back, knees bend ?Scoot hips to the L , leave shoulders in place ?Drop knees to the R side ?resting onto pillows to keep leg at the same width of hips ?Pillow under LR thigh to minimize too much strain  ? ?___ ? Deep core 1-2 (handout)  ?

## 2022-03-16 ENCOUNTER — Other Ambulatory Visit: Payer: Self-pay

## 2022-03-16 ENCOUNTER — Ambulatory Visit: Payer: BC Managed Care – PPO | Admitting: Occupational Therapy

## 2022-03-16 DIAGNOSIS — M25531 Pain in right wrist: Secondary | ICD-10-CM | POA: Diagnosis not present

## 2022-03-16 DIAGNOSIS — M25631 Stiffness of right wrist, not elsewhere classified: Secondary | ICD-10-CM

## 2022-03-16 DIAGNOSIS — M6281 Muscle weakness (generalized): Secondary | ICD-10-CM

## 2022-03-16 NOTE — Therapy (Signed)
[place] ?[facility] ?[address] ?[place], [place], [address] ?Phone: [phone]   Fax:  [phone] ? ?Occupational Therapy Treatment ? ?Patient Details  ?Name: Debbie Newman ?MRN: [id number] ?Date of Birth: 1961/01/04 ?Referring Provider (OT): Marney DoctorMcGhee ? ? ?Encounter Date: 03/16/2022 ? ? OT End of Session - 03/16/22 1701   ? ? Visit Number 6   ? Number of Visits 15   ? Date for OT Re-Evaluation 04/13/22   ? OT Start Time 1534   ? OT Stop Time 1619   ? OT Time Calculation (min) 45 min   ? Activity Tolerance Patient tolerated treatment well   ? Behavior [facility] for tasks assessed/performed   ? ?  ?  ? ?  ? ? ?Past Medical History:  ?Diagnosis Date  ? Female bladder prolapse 2013  ? Hiatal hernia 2013  ? ? ?Past Surgical History:  ?Procedure Laterality Date  ? CESAREAN SECTION  1999  ? CHOLECYSTECTOMY N/A 07/03/2015  ? Procedure: LAPAROSCOPIC CHOLECYSTECTOMY WITH INTRAOPERATIVE CHOLANGIOGRAM;  Surgeon: Nadeen LandauJarvis Wilton Smith, MD;  Location: [facility] ORS;  Service: General;  Laterality: N/A;  ? TONSILLECTOMY    ? ? ?There were no vitals filed for this visit. ? ? Subjective Assessment - 03/16/22 1537   ? ? Subjective  My wrist has been bothering me since Saturday-throbbing on both sides of my wrist.  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 They were last evaluated by Horris LatinoLance McGhee, PA on 01/15/2022. At that time, the plan was to perform a right wrist MRI arthrogram, use a short arm wrist brace, then follow-up after MRI. She had her MRI on 01/27/2022. 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 She was recommended follow-up with reevaluation and consideration of diagnostic/therapeutic injection to help determine her primary pain generator   ? Patient Stated Goals Would like my pain in my wrist to be better to be able to use it more without wearing splint all the time   ? Currently in Pain? Yes   ? Pain Score 4    ? Pain Location Wrist   ? Pain Orientation Right;Medial;Lateral   ? Pain Descriptors / Indicators Aching;Tender   ? ?  ?  ? ?  ? ? ? ? ? ? ? ? ? ? ? ? ?  Skin check done prior to ionto-patient tolerated well prior-patient to keep medicine pad on for hour afterwards ? ? OT Treatments/Exercises (OP) - 03/16/22 0001   ? ?  ? Iontophoresis  ? Type of Iontophoresis Dexamethasone   ? Location radial wrist /FCU   ? Dose med patch, 2.0 current   ? Time 19   ?  ? RUE Contrast Bath  ? Time 8 minutes   ? Comments decrease pain   ? ?  ?  ? ?  ? ? ?  ?patient this date with increase pain on radial wrist and ulnar side of wrist since Saturday  ?Continues to wear splint with most  all activity about 85% of time ?Patient want to avoid surgery done this date third session of ionto over FCU on the right wrist ?  ?  ?  ?  Done contrast this date because of increased pain-pain decrease to 2-3/10 at rest ?pt to do at home contrast - prior to [facility] for wrist in all planes - pain free  ?Wrist splint heavy act- and Benik neoprene on L  ?Ed on and hand out review on joint protection principles last time -and reinforce again  ?This date reviewed with the patient different technique of flipping mattress of her patient-instead of using grip with wrist flexion to pull-use volar wrist and forearm to left, hold in place walk around foot and end of bed hold with 1 hand guiding mattress back with other hand ? ?Soft tissue mobs to [facility] and CT spreads and  webspace  ? Joint mobs - traction and compression to wrist - no pain  ?AAROM and AROM for wrist pain free range  ?  ? ? ? ? ? ? OT Education - 03/16/22 1701   ? ? Education Details progress and HEP   ? Person(s) Educated Patient   ? Methods Explanation;Demonstration;Tactile cues;Verbal cues;Handout   ? Comprehension Verbal cues required;Returned demonstration;Verbalized understanding   ? ?  ?  ? ?  ? ? ? ? ? ? OT Long Term Goals - 03/02/22 1728   ? ?  ? OT LONG TERM GOAL #1  ? Title Pt to be independent in HEP to increase  R wrist AROM to WNL to wear splint 50% of time and pain less than 2/10   ? Baseline wrist splint more than 85% of time - pain 2-8/10 and decrease wrist AROM in all planes  STill wearing splint most all time , increase ROM - but pain still 1-3/10 with AROM -shooting pain at times   ? Time 4   ? Period Weeks   ? Status On-going   ? Target Date 03/30/22   ?  ? OT LONG TERM GOAL #2  ? Title Pt to verbalize and demo 3 joint protection or modifications /AE to use to decrease pain in R wrist and ease of doing tasks in ADL's and IADL's   ? Status Achieved   ?  ? OT LONG TERM GOAL #3  ? Title Pt show show increase grip and prenhension by 1-3 lbs without pain to wear splint less than 50% of time or more wrist wrap   ? Baseline 85% or more in wrist splint -pain 2-8/10 with use - grip R 44,L 45 ,  3 point 11 lbs , lat grip R 11 , L 10 lbs  NOW pain still 2-3/10 assessment better, splint same wearing , grip and prehension same   ? Time 6   ? Period Weeks   ? Status On-going   ? Target Date 04/13/22   ? ?  ?  ? ?  ? ? ? ? ? ? ? ? Plan - 03/16/22 1701   ? ? Clinical Impression Statement Pt report having pain in R wrist for a while and wearing wrist splint since Nov 2022 - She had her MRI on 01/27/2022. 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 She had on 02/01/22 diagnostic/therapeutic injection to help determine her primary pain generator. Pt also refer to OT  to decrease pain and increase AROM and strength for functional use-  NOW since eval she showed increase AROM at wrist to WNL  with pain less than 3/10 -but this date patient arrived with increased pain on radial wrist and ulnar wrist since Saturday.  Activity at work that bothers her wrist the most is flipping a mattress using grip with wrist flexion.  Reviewed with patient different techniques to modify flipping mattress she cannot have a second person or help with that task.. Tenderness and pain on radial wrist /FCU? again today. . No popping  at supination end range and no pain. Pt ed on HEP for decrease pain , increase or maintain her motion. Pt would want to avoid surgery because of recovery time-initiated ionto with dexamethazone last  week- 3rd session use radial wrist/ FCU today . Pt to keep pain under 2/10 - at eval ed and reviewed hand out about joint protection  principles and modifications - pt to cont at home with homeprogram.   ? OT Occupational Profile and History Problem Focused Assessment - Including review of records relating to presenting problem   ? Occupational performance deficits (Please refer to evaluation for details): ADL's;IADL's;Work;Play;Leisure;Social Participation   ? Body Structure / Function / Physical Skills ADL;Decreased knowledge of use of DME;Strength;Pain;UE functional use;ROM;IADL;Flexibility   ? Rehab Potential Good   ? Clinical Decision Making Limited treatment options, no task modification necessary   ? Comorbidities Affecting Occupational Performance: May have comorbidities impacting occupational performance   ? Modification or Assistance to Complete Evaluation  No modification of tasks or assist necessary to complete eval   ? OT Frequency 2x / week   ? OT Duration 6 weeks   ? OT Treatment/Interventions  Self-care/ADL training;Paraffin;Fluidtherapy;Contrast Bath;Therapeutic exercise;Patient/family education;Manual Therapy;Passive range of motion;Splinting;DME and/or AE instruction;Iontophoresis   ? Consulted a

## 2022-03-18 ENCOUNTER — Ambulatory Visit: Payer: BC Managed Care – PPO | Admitting: Occupational Therapy

## 2022-03-18 DIAGNOSIS — M25531 Pain in right wrist: Secondary | ICD-10-CM

## 2022-03-18 DIAGNOSIS — M6281 Muscle weakness (generalized): Secondary | ICD-10-CM

## 2022-03-18 DIAGNOSIS — M25631 Stiffness of right wrist, not elsewhere classified: Secondary | ICD-10-CM

## 2022-03-18 NOTE — Therapy (Signed)
[place] ?[facility] ?[address] ?[place], [place], [address] ?Phone: [phone]   Fax:  [phone] ? ?Occupational Therapy Treatment ? ?Patient Details  ?Name: Debbie Newman ?MRN: [id number] ?Date of Birth: [birth year] ?Referring Provider (OT): Marney Doctor ? ? ?Encounter Date: 03/18/2022 ? ? OT End of Session - 03/18/22 1624   ? ? Visit Number 7   ? Number of Visits 15   ? Date for OT Re-Evaluation 04/13/22   ? OT Start Time 1540   ? OT Stop Time 1610   ? OT Time Calculation (min) 30 min   ? Activity Tolerance Patient tolerated treatment well   ? Behavior During Therapy [facility] for tasks assessed/performed   ? ?  ?  ? ?  ? ? ?Past Medical History:  ?Diagnosis Date  ? Female bladder prolapse 2013  ? Hiatal hernia 2013  ? ? ?Past Surgical History:  ?Procedure Laterality Date  ? CESAREAN SECTION  1999  ? CHOLECYSTECTOMY N/A 07/03/2015  ? Procedure: LAPAROSCOPIC CHOLECYSTECTOMY WITH INTRAOPERATIVE CHOLANGIOGRAM;  Surgeon: Nadeen Landau, MD;  Location: [facility] ORS;  Service: General;  Laterality: N/A;  ? TONSILLECTOMY    ? ? ?There were no vitals filed for this visit. ? ? Subjective Assessment - 03/18/22 1623   ? ? Subjective  My wrist pain is better.  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  I am wearing still my brace most of the time.   ? 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 They were last evaluated by Horris Latino, PA on 01/15/2022. At that time, the plan was to perform a right wrist MRI arthrogram, use a short arm wrist brace, then follow-up after MRI. She had her MRI on 01/27/2022. 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 She was recommended follow-up with reevaluation and consideration of diagnostic/therapeutic injection to help determine her primary pain generator   ? Patient Stated Goals Would like my pain in my wrist to be better to be able to use it more without wearing splint all the time   ? Currently in Pain? Yes   ? Pain Score 2    ? Pain Location Wrist   ? Pain Orientation Right   ? Pain Descriptors / Indicators Aching   ? Pain Type Acute pain;Chronic pain   ? ?  ?  ? ?  ? ? ? ? ? ?  Patient arrived this date with less pain at rest using new technique to flip mattress for patient at work.  Patient report had less pain. ?Patient active range of motion at wrist within normal limits this date, pain only with FCU-wrist flexion endrange and ulnar deviation-2/10 pain  ?  ?  ?  Continue wrist splint heavy act- and Benik neoprene on L  ?Ed on and hand out review on joint protection principles last time -and reinforce again  ?Last date reviewed with the patient different technique of flipping mattress of her patient-instead of using grip with wrist flexion to pull-use volar wrist and forearm to left, hold in place walk around foot and end of bed hold with 1  hand guiding mattress back with other hand ?  ? ?AAROM and AROM for wrist pain free range in all planes ?  ?  ?  ?  ?  ? ? ? ? ?  Skin check done prior to ionto-patient tolerated well in the past-patient to keep medication patch on hour after leaving ? ? ? OT Treatments/Exercises (OP) - 03/18/22 0001   ? ?  ? Iontophoresis  ? Type of Iontophoresis Dexamethasone   ? Location First dorsal compartment right   ? Dose med patch, 2.0 current   ? Time 19   ? ?  ?  ? ?  ? ? ? ? ? ? ? ? ? OT Education - 03/18/22 1624   ? ? Education Details progress and HEP   ? Person(s) Educated Patient   ? Methods Explanation;Demonstration;Tactile cues;Verbal cues;Handout   ? Comprehension Verbal cues  required;Returned demonstration;Verbalized understanding   ? ?  ?  ? ?  ? ? ? ? ? ? OT Long Term Goals - 03/02/22 1728   ? ?  ? OT LONG TERM GOAL #1  ? Title Pt to be independent in HEP to increase  R wrist AROM to WNL to wear splint 50% of time and pain less than 2/10   ? Baseline wrist splint more than 85% of time - pain 2-8/10 and decrease wrist AROM in all planes  STill wearing splint most all time , increase ROM - but pain still 1-3/10 with AROM -shooting pain at times   ? Time 4   ? Period Weeks   ? Status On-going   ? Target Date 03/30/22   ?  ? OT LONG TERM GOAL #2  ? Title Pt to verbalize and demo 3 joint protection or modifications /AE to use to decrease pain in R wrist and ease of doing tasks in ADL's and IADL's   ? Status Achieved   ?  ? OT LONG TERM GOAL #3  ? Title Pt show show increase grip and prenhension by 1-3 lbs without pain to wear splint less than 50% of time or more wrist wrap   ? Baseline 85% or more in wrist splint -pain 2-8/10 with use - grip R 44,L 45 ,  3 point 11 lbs , lat grip R 11 , L 10 lbs  NOW pain still 2-3/10 assessment better, splint same wearing , grip and prehension same   ? Time 6   ? Period Weeks   ? Status On-going   ? Target Date 04/13/22   ? ?  ?  ? ?  ? ? ? ? ? ? ? ? Plan - 03/18/22 1625   ? ? Clinical Impression Statement Pt report having pain in R wrist for a while and wearing wrist splint since Nov 2022 - She had her MRI on 01/27/2022. 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 She had on 02/01/22 diagnostic/therapeutic injection to help determine her primary pain generator. Pt also refer to OT to decrease pain and increase AROM and strength for functional use-  NOW since eval she showed increase AROM at wrist to WNL., with pain less than 3/10.  Last  session patient arrived with increased pain on radial wrist and ulnar wrist since Saturday.  Activity at work that bothered her wrist the most is flipping a mattress using grip with wrist  flexion.  Reviewed with patient different techniques to modify flipping mattress she cannot have a second person or help with that task.  This date patient did arrive using new technique to flip mattress and report less pain.  Patient this date only tender and to/10 pain at FCU. No popping  at supination end range and no pain. Pt ed on HEP for decrease pain , increase or maintain her motion. Pt would want to avoid surgery because of recovery time-4th session use radial wrist/ FCU today . Pt to keep pain under 2/10 - at eval ed and reviewed hand out about joint protection  principles and modifications - pt to cont at home with homeprogram.   ? OT Occupational Profile and History Problem Focused Assessment - Including review of records relating to presenting problem   ? Occupational performance deficits (Please refer to evaluation for details): ADL's;IADL's;Work;Play;Leisure;Social Participation   ? Body Structure / Function / Physical Skills ADL;Decreased knowledge of use of DME;Strength;Pain;UE functional use;ROM;IADL;Flexibility   ? Rehab Potential Good   ? Clinical Decision Making Limited treatment options, no task modification necessary   ? Comorbidities Affecting Occupational Performance: May have comorbidities impacting occupational performance   ? Modification or Assistance to Complete Evaluation  No modification of tasks or assist necessary to complete eval   ? OT Frequency 2x / week   ? OT Duration 6 weeks   ? OT Treatment/Interventions Self-care/ADL training;Paraffin;Fluidtherapy;Contrast Bath;Therapeutic exercise;Patient/family education;Manual Therapy;Passive range of motion;Splinting;DME and/or AE instruction;Iontophoresis   ? Consulted and Agree with Plan of Care Patient   ? ?  ?  ? ?  ? ? ?Patient will benefit from  skilled therapeutic intervention in order to improve the following deficits and impairments:   ?Body Structure / Function / Physical Skills: ADL, Decreased knowledge of use of DME, Strength, Pain, UE functional

## 2022-03-22 ENCOUNTER — Ambulatory Visit: Payer: BC Managed Care – PPO | Attending: Student | Admitting: Occupational Therapy

## 2022-03-22 DIAGNOSIS — R2689 Other abnormalities of gait and mobility: Secondary | ICD-10-CM | POA: Diagnosis present

## 2022-03-22 DIAGNOSIS — M25561 Pain in right knee: Secondary | ICD-10-CM | POA: Diagnosis present

## 2022-03-22 DIAGNOSIS — M5442 Lumbago with sciatica, left side: Secondary | ICD-10-CM | POA: Insufficient documentation

## 2022-03-22 DIAGNOSIS — R278 Other lack of coordination: Secondary | ICD-10-CM | POA: Diagnosis present

## 2022-03-22 DIAGNOSIS — M6281 Muscle weakness (generalized): Secondary | ICD-10-CM | POA: Insufficient documentation

## 2022-03-22 DIAGNOSIS — M25631 Stiffness of right wrist, not elsewhere classified: Secondary | ICD-10-CM | POA: Insufficient documentation

## 2022-03-22 DIAGNOSIS — M25531 Pain in right wrist: Secondary | ICD-10-CM | POA: Diagnosis present

## 2022-03-22 DIAGNOSIS — G8929 Other chronic pain: Secondary | ICD-10-CM | POA: Insufficient documentation

## 2022-03-22 NOTE — Therapy (Signed)
[place] ?[facility] ?[address] ?[place], [place], [address] ?Phone: [phone]   Fax:  [phone] ? ?Occupational Therapy Treatment ? ?Patient Details  ?Name: Debbie Newman ?MRN: [id number] ?Date of Birth: 02/04/1961 ?Referring Provider (OT): Marney DoctorMcGhee ? ? ?Encounter Date: 03/22/2022 ? ? OT End of Session - 03/22/22 1624   ? ? Visit Number 8   ? Number of Visits 15   ? Date for OT Re-Evaluation 04/13/22   ? OT Start Time 1605   ? OT Stop Time 1640   ? OT Time Calculation (min) 35 min   ? Activity Tolerance Patient tolerated treatment well   ? [facility] assessed/performed   ? ?  ?  ? ?  ? ? ?Past Medical History:  ?Diagnosis Date  ? Female bladder prolapse 2013  ? Hiatal hernia 2013  ? ? ?Past Surgical History:  ?Procedure Laterality Date  ? CESAREAN SECTION  1999  ? CHOLECYSTECTOMY N/A 07/03/2015  ? Procedure: LAPAROSCOPIC CHOLECYSTECTOMY WITH INTRAOPERATIVE CHOLANGIOGRAM;  Surgeon: Nadeen LandauJarvis Wilton Smith, MD;  Location: [facility] ORS;  Service: General;  Laterality: N/A;  ? TONSILLECTOMY    ? ? ?There were no vitals filed for this visit. ? ? Subjective Assessment - 03/22/22 1622   ? ? Subjective  My wrist is much better do not have as much pain anymore.  But I am still wearing my brace 85% of the time or more.  He is not as tender even anymore like it used to   ? 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 They were last evaluated by Horris LatinoLance McGhee, PA on 01/15/2022. At that time, the plan was to perform a right wrist MRI arthrogram, use a short arm wrist brace, then follow-up after MRI. She had her MRI on 01/27/2022. 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 She was recommended follow-up with reevaluation and consideration of diagnostic/therapeutic injection to help determine her primary pain generator   ? Patient Stated Goals Would like my pain in my wrist to be better to be able to use it more without wearing splint all the time   ? Currently in Pain? No/denies   ? ?  ?  ? ?  ? ? ? ? ? [facility] OT Assessment - 03/22/22 0001   ? ?  ? AROM  ? Right Wrist Extension 70 Degrees   ? Right Wrist Flexion 85 Degrees   ? Right Wrist Radial Deviation 25 Degrees   ? Right Wrist Ulnar Deviation 40 Degrees   ?  ? Strength  ? Right Hand Grip (lbs) 45   ? Right Hand Lateral Pinch 11 lbs   ? Right Hand 3 Point Pinch 11 lbs   ? Left Hand Grip (lbs) 45   ? Left Hand Lateral Pinch 10 lbs   ? Left Hand 3 Point Pinch 12 lbs   ? ?  ?  ? ?  ? ? ? ?  Patient arrived this date with less pain reported since last week changing method of changing and flipping mattress at work.    Patient report had less pain. ?Patient active range of motion at wrist within  normal limits this date -with no increase symptoms.  As well as grip and prehension strength pain-free this date.  ?  ?  Continue wrist splint heavy act-patient can try and wean into Benik neoprene wrist wrap if needed with lighter activities pain-free.   ?Ed on and hand out review on joint protection principles last week-and reinforce again  ?  ?  ?AAROM and AROM for wrist pain free range in all planes ?Patient continues to be tender over FCU but no pain with active range of motion. ?  ?  ?  ?  ?  ?  ?  ?  ?  ?  Skin check done prior to ionto-patient tolerated well in the past-patient to keep medication patch on hour after leaving ? ? ? ? ? ? ? OT Treatments/Exercises (OP) - 03/22/22 0001   ? ?  ? Iontophoresis  ? Type of Iontophoresis Dexamethasone   ? Location FCU R at wrist   ? Dose med patch, 2.0 current   ? Time 19   ? ?  ?  ? ?  ? ? ? ?5th  session today ? ? ? ? ? OT Education - 03/22/22 1624   ? ? Education Details progress and HEP   ? Person(s) Educated Patient   ? Methods Explanation;Demonstration;Tactile cues;Verbal cues;Handout   ? Comprehension Verbal cues required;Returned demonstration;Verbalized understanding   ? ?  ?  ? ?  ? ? ? ? ? ? OT Long Term Goals - 03/02/22 1728   ? ?  ? OT LONG TERM GOAL #1  ? Title Pt to be independent in HEP to increase  R wrist AROM to WNL to wear splint 50% of time and pain less than 2/10   ? Baseline wrist splint more than 85% of time - pain 2-8/10 and decrease wrist AROM in all planes  STill wearing splint most all time , increase ROM - but pain still 1-3/10 with AROM -shooting pain at times   ? Time 4   ? Period Weeks   ? Status On-going   ? Target Date 03/30/22   ?  ? OT LONG TERM GOAL #2  ? Title Pt to verbalize and demo 3 joint protection or modifications /AE to use to decrease pain in R wrist and ease of doing tasks in ADL's and IADL's   ? Status Achieved   ?  ? OT LONG TERM GOAL #3  ? Title Pt show show increase grip and prenhension by 1-3 lbs without pain to wear splint less than 50% of time or more wrist wrap   ? Baseline 85% or more in wrist splint -pain 2-8/10 with use - grip R 44,L 45 ,  3 point 11 lbs , lat grip R 11 , L 10 lbs  NOW pain still 2-3/10 assessment better, splint same wearing , grip and prehension same   ? Time 6   ? Period Weeks   ? Status On-going   ? Target Date 04/13/22   ? ?  ?  ? ?  ? ? ? ? ? ? ? ? Plan - 03/22/22 1624   ? ? Clinical Impression Statement Pt report having pain in R wrist for a while and wearing wrist splint since Nov 2022 - She had her MRI on 01/27/2022. 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 She  had on 02/01/22 diagnostic/therapeutic injection  to help determine her primary pain generator. Pt also refer to OT to decrease pain and increase AROM and strength for functional use-  NOW since evaluation patient show active range of motion at right wrist within normal limits and no pain.  Grip and prehension strength with no pain increase.  Patient do report wearing a wrist immobilization splint still 85% of time.  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  Modified activity with great decrease of pain.  Patient continues to be tender over FCU on the right.  Patient can try and wean when not at work to [facility] neoprene wrist wrap if pain-free.. Pt to keep pain under 2/10 -patient continue to benefit from OT skilled services to decrease pain increase range of motion and strength without increased symptoms.   ? OT Occupational Profile and History Problem Focused Assessment - Including review of records relating to presenting problem   ? Occupational performance deficits (Please refer to evaluation for details): ADL's;IADL's;Work;Play;Leisure;Social Participation   ? Body Structure / Function / Physical Skills ADL;Decreased knowledge of use of DME;Strength;Pain;UE functional use;ROM;IADL;Flexibility   ? Rehab Potential Good   ? Clinical Decision Making Limited treatment options, no task modification necessary   ? Comorbidities Affecting Occupational Performance: May have comorbidities impacting occupational performance   ? Modification or Assistance to Complete Evaluation  No modification of tasks or assist necessary to complete eval   ? OT Frequency 2x / week   ? OT Duration 6 weeks   ? OT Treatment/Interventions Self-care/ADL training;Paraffin;Fluidtherapy;Contrast Bath;Therapeutic exercise;Patient/family education;Manual Therapy;Passive range of motion;Splinting;DME and/or AE instruction;Iontophoresis   ? Consulted and Agree with Plan of Care Patient   ? ?  ?  ? ?   ? ? ?Patient will benefit from skilled therapeutic intervention in order to improve the following deficits and impairments:   ?Body Structure / Function / Physical Skills: ADL, Decreased knowledge of use of DME, Str

## 2022-03-25 ENCOUNTER — Ambulatory Visit: Payer: BC Managed Care – PPO | Admitting: Occupational Therapy

## 2022-03-25 DIAGNOSIS — M25631 Stiffness of right wrist, not elsewhere classified: Secondary | ICD-10-CM

## 2022-03-25 DIAGNOSIS — M25531 Pain in right wrist: Secondary | ICD-10-CM

## 2022-03-25 DIAGNOSIS — M6281 Muscle weakness (generalized): Secondary | ICD-10-CM

## 2022-03-25 NOTE — Therapy (Signed)
[place] ?[facility] ?[address] ?[place], [place], [address] ?Phone: [phone]   Fax:  [phone] ? ?Occupational Therapy Treatment ? ?Patient Details  ?Name: Debbie Newman ?MRN: [id number] ?Date of Birth: 06-May-1961 ?Referring Provider (OT): Marney Doctor ? ? ?Encounter Date: 03/25/2022 ? ? OT End of Session - 03/25/22 1604   ? ? Visit Number 9   ? Number of Visits 15   ? Date for OT Re-Evaluation 04/13/22   ? OT Start Time 1530   ? OT Stop Time 1607   ? OT Time Calculation (min) 37 min   ? Activity Tolerance Patient tolerated treatment well   ? Behavior During Therapy [facility] for tasks assessed/performed   ? ?  ?  ? ?  ? ? ?Past Medical History:  ?Diagnosis Date  ? Female bladder prolapse 2013  ? Hiatal hernia 2013  ? ? ?Past Surgical History:  ?Procedure Laterality Date  ? CESAREAN SECTION  1999  ? CHOLECYSTECTOMY N/A 07/03/2015  ? Procedure: LAPAROSCOPIC CHOLECYSTECTOMY WITH INTRAOPERATIVE CHOLANGIOGRAM;  Surgeon: Nadeen Landau, MD;  Location: [facility] ORS;  Service: General;  Laterality: N/A;  ? TONSILLECTOMY    ? ? ?There were no vitals filed for this visit. ? ? Subjective Assessment - 03/25/22 1603   ? ? Subjective  I am doing better my wrist.  I do not have to flip the mattress today only a little bit on the thumb side of my wrist.   ? 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 They were last evaluated by Horris Latino, PA on 01/15/2022. At that time, the plan was to perform a right wrist MRI arthrogram, use a short arm wrist brace, then follow-up after MRI. She had her MRI on 01/27/2022. 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 She was recommended follow-up with reevaluation and consideration of diagnostic/therapeutic injection to help determine her primary pain generator   ? Patient Stated Goals Would like my pain in my wrist to be better to be able to use it more without wearing splint all the time   ? Currently in Pain? Yes   ? Pain Score 1    ? Pain Location Wrist   ? Pain Descriptors / Indicators Aching   ? Pain Type Acute pain   ? Pain Onset More than a month ago   ? Pain Frequency Intermittent   ? ?  ?  ? ?  ? ? ? ? ?  Patient arrived this date with less pain reported since last week changing method of changing and flipping mattress at work.    Patient report had less pain. ?Patient active range of motion at wrist within normal limits this date - had 1-2/10 pain on radial wrist - orver FCU- Grip and prehension strength pain-free this date.  ?  ?  Continue wrist splint heavy act-patient can try and wean into Benik neoprene wrist wrap if needed with lighter activities pain-free.   ?Ed on and hand out review on joint protection principles last week-and reinforce again  ?  ?  ?AAROM and AROM for wrist pain free range in all planes ?Patient continues to be tender over FCR ?Did  attempt today isometric - had pain over FCR at radial wrist  ?  ?  ?  ?  ?  ?  ?  ?  ?  ?  Skin check done prior to ionto-patient tolerated well in the past-patient to keep medication patch on hour after leaving ?  ? ?Today 5th session of ionto ? ? ? ? ? ? ? ? OT Treatments/Exercises (OP) - 03/25/22 0001   ? ?  ? Iontophoresis  ? Type of Iontophoresis Dexamethasone   ? Location FCU R at wrist   ? Dose med patch, 2.0 current   ? Time 19   ? ?  ?  ? ?  ? ? ? ? ? ? ? ? ? OT Education - 03/25/22 1604   ? ? Education Details progress and HEP   ? Person(s) Educated Patient   ? Methods Explanation;Demonstration;Tactile cues;Verbal cues;Handout   ? Comprehension Verbal cues required;Returned demonstration;Verbalized  understanding   ? ?  ?  ? ?  ? ? ? ? ? ? OT Long Term Goals - 03/02/22 1728   ? ?  ? OT LONG TERM GOAL #1  ? Title Pt to be independent in HEP to increase  R wrist AROM to WNL to wear splint 50% of time and pain less than 2/10   ? Baseline wrist splint more than 85% of time - pain 2-8/10 and decrease wrist AROM in all planes  STill wearing splint most all time , increase ROM - but pain still 1-3/10 with AROM -shooting pain at times   ? Time 4   ? Period Weeks   ? Status On-going   ? Target Date 03/30/22   ?  ? OT LONG TERM GOAL #2  ? Title Pt to verbalize and demo 3 joint protection or modifications /AE to use to decrease pain in R wrist and ease of doing tasks in ADL's and IADL's   ? Status Achieved   ?  ? OT LONG TERM GOAL #3  ? Title Pt show show increase grip and prenhension by 1-3 lbs without pain to wear splint less than 50% of time or more wrist wrap   ? Baseline 85% or more in wrist splint -pain 2-8/10 with use - grip R 44,L 45 ,  3 point 11 lbs , lat grip R 11 , L 10 lbs  NOW pain still 2-3/10 assessment better, splint same wearing , grip and prehension same   ? Time 6   ? Period Weeks   ? Status On-going   ? Target Date 04/13/22   ? ?  ?  ? ?  ? ? ? ? ? ? ? ? Plan - 03/25/22 1607   ? ? Clinical Impression Statement Pt report having pain in R wrist for a while and wearing wrist splint since Nov 2022 - She had her MRI on 01/27/2022. 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 She had on 02/01/22 diagnostic/therapeutic injection to help determine her primary pain generator. Pt also refer to OT to decrease pain and increase AROM and strength for functional use-  NOW since evaluation patient show active range of motion at right wrist within normal limits and no pain.  Grip and prehension strength  with no pain increase.  Patient do report wearing a wrist immobilization splint still 85% of time.  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  Modified activity with great decrease of pain.  Patient continues to be tender over FCU on the right.  Patient can try and wean when not at work to [facility] neoprene wrist wrap if pain-free.. Pt to keep pain under 2/10 -patient continue to benefit from OT skilled services to decrease pain increase range of motion and strength without increased symptoms.   ? OT Occupational Profile and History Problem Focused Assessment - Including review of records relating to presenting problem   ? Occupational performance deficits (Please refer to evaluation for details): ADL's;IADL's;Work;Play;Leisure;Social Participation   ? Body Structure / Function / Physical Skills ADL;Decreased knowledge of use of DME;Strength;Pain;UE functional use;ROM;IADL;Flexibility   ? Rehab Potential Good   ? Clinical Decision Making Limited treatment options, no task modification necessary   ? Comorbidities Affecting Occupational Performance: May have comorbidities impacting occupational performance   ? Modification or Assistance to Complete Evaluation  No modification of tasks or assist necessary to complete eval   ? OT Frequency 2x / week   ? OT Duration 4 weeks   ? OT Treatment/Interventions Self-care/ADL training;Paraffin;Fluidtherapy;Contrast Bath;Therapeutic exercise;Patient/family education;Manual Therapy;Passive range of motion;Splinting;DME and/or AE instruction;Iontophoresis   ? Consulted and Agree with Plan of Care Patient   ? ?  ?  ? ?  ? ? ?Patient will benefit from skilled therapeutic intervention in order to improve the following deficits and impairments:   ?Body Structure / Function / Physical Skills: ADL, Decreased knowledge of use of DME, Strength, Pain, UE functional use, ROM, IADL, Flexibility ?  ?   ? ? ?Visit Diagnosis: ?Pain in right wrist ? ?Muscle weakness (generalized) ? ?Stiffness of right wrist, not elsewhere classified ? ? ? ?Problem List ?There are no problems to display for this patient. ? ? ?Du

## 2022-03-29 ENCOUNTER — Ambulatory Visit: Payer: BC Managed Care – PPO | Admitting: Occupational Therapy

## 2022-03-29 DIAGNOSIS — M25531 Pain in right wrist: Secondary | ICD-10-CM | POA: Diagnosis not present

## 2022-03-29 DIAGNOSIS — M6281 Muscle weakness (generalized): Secondary | ICD-10-CM

## 2022-03-29 DIAGNOSIS — M25631 Stiffness of right wrist, not elsewhere classified: Secondary | ICD-10-CM

## 2022-03-29 NOTE — Therapy (Signed)
[place] ?[facility] ?[address] ?[place], [place], [address] ?Phone: [phone]   Fax:  [phone] ? ?Occupational Therapy Treatment ? ?Patient Details  ?Name: Debbie Newman ?MRN: [id number] ?Date of Birth: Mar 17, 1961 ?Referring Provider (OT): Marney Doctor ? ? ?Encounter Date: 03/29/2022 ? ?OT End of Session - 03/25/22 1604   ?  ?  Visit Number 10  ?  Number of Visits 15   ?  Date for OT Re-Evaluation 04/13/22   ?  OT Start Time 1530   ?  OT Stop Time 1607   ?  OT Time Calculation (min) 37 min   ?  Activity Tolerance Patient tolerated treatment well   ?  Behavior During Therapy [facility] for tasks assessed/performed   ?  ? ? ? ?Past Medical History:  ?Diagnosis Date  ? Female bladder prolapse 2013  ? Hiatal hernia 2013  ? ? ?Past Surgical History:  ?Procedure Laterality Date  ? CESAREAN SECTION  1999  ? CHOLECYSTECTOMY N/A 07/03/2015  ? Procedure: LAPAROSCOPIC CHOLECYSTECTOMY WITH INTRAOPERATIVE CHOLANGIOGRAM;  Surgeon: Nadeen Landau, MD;  Location: [facility] ORS;  Service: General;  Laterality: N/A;  ? TONSILLECTOMY    ? ? ? ? ? ? ?Patient arrived this date with less pain reported since last week changing method of changing and flipping mattress at work.   But also her pt got new bed that do not require flipping  Patient report had less pain. ?Patient active range of motion at wrist within normal limits this date -  with no pain ?Cont to be tender over FCR   ?  ?  Continue wrist splint heavy act-patient can try and wean into Benik neoprene wrist wrap/ or no splint but 2 hrs at time -  if needed with lighter activities pain-free.   ?Ed on and hand out review on joint protection principles last week-and reinforce again  ?  ?  ?AAROM and AROM for wrist pain free range in all planes ?Patient continues to be tender over FCR ?Attempt isometric next visit -if pain free - was last time still some discomfort  ?  ?  ?  ?  ?  ?  ?  ?  ?  ?  Skin check done prior to ionto-patient   note tolerating well - look like tiny scratch - or she says she had perfume on today - Removed patch afterwards  ?  ?  ?Today 7th session of ionto ?  ? ? ? ? ? ? ? ? ? ? ? OT Treatments/Exercises (OP) - 03/29/22 0001   ? ?  ? Iontophoresis  ? Type of Iontophoresis Dexamethasone   ? Location FCU R at wrist   ? Dose med patch, 1.2 current   ? Time 19   ? ?  ?  ? ?  ? ?shortening time - with lower current - to do skin check - no blisters  ? ? ? ? ? ? ? OT Education - 03/29/22 1706   ? ? Education Details progress and HEP   ? Person(s) Educated Patient   ? Methods Explanation;Demonstration;Tactile cues;Verbal cues;Handout   ? Comprehension Verbal cues required;Returned demonstration;Verbalized understanding   ? ?  ?  ? ?  ? ? ? ? ? ? OT Long Term Goals - 03/02/22 1728   ? ?  ? OT LONG TERM GOAL #1  ? Title Pt to be independent in HEP to increase  R wrist AROM to WNL to wear splint 50%  of time and pain less than 2/10   ? Baseline wrist splint more than 85% of time - pain 2-8/10 and decrease wrist AROM in all planes  STill wearing splint most all time , increase ROM - but pain still 1-3/10 with AROM -shooting pain at times   ? Time 4   ? Period Weeks   ? Status On-going   ? Target Date 03/30/22   ?  ? OT LONG TERM GOAL #2  ? Title Pt to verbalize and demo 3 joint protection or modifications /AE to use to decrease pain in R wrist and ease of doing tasks in ADL's and IADL's   ? Status Achieved   ?  ? OT LONG TERM GOAL #3  ? Title Pt show show increase grip and prenhension by 1-3 lbs without pain to wear splint less than 50% of time or more wrist wrap   ? Baseline 85% or more in wrist splint -pain 2-8/10 with use - grip R 44,L 45 ,  3 point 11 lbs , lat grip R 11 , L 10 lbs  NOW pain still 2-3/10 assessment better, splint same wearing , grip and prehension same   ? Time 6   ? Period Weeks   ? Status On-going   ? Target Date 04/13/22   ? ?  ?  ? ?  ? ? ? ? ? ? ? ? ? ?Patient will benefit from skilled therapeutic intervention  in order to improve the following deficits and impairments:   ?  ?  ?  ? ? ?Visit Diagnosis: ?Pain in right wrist ? ?Muscle weakness (generalized) ? ?Stiffness of right wrist, not elsewhere classified ? ? ? ?Problem List ?There are no problems to display for this patient. ? ? ?Oletta Cohn, OTR/L,CLT ?03/29/2022, 5:07 PM ? ?[place] ?[facility] ?[address] ?[place], [place], [address] ?Phone: [phone]   Fax:  [phone] ? ?Name: Debbie Newman ?MRN: [id number] ?Date of Birth: 11-12-1961 ? ?

## 2022-03-31 ENCOUNTER — Ambulatory Visit: Payer: BC Managed Care – PPO | Admitting: Physical Therapy

## 2022-03-31 DIAGNOSIS — R2689 Other abnormalities of gait and mobility: Secondary | ICD-10-CM

## 2022-03-31 DIAGNOSIS — M6281 Muscle weakness (generalized): Secondary | ICD-10-CM

## 2022-03-31 DIAGNOSIS — M25531 Pain in right wrist: Secondary | ICD-10-CM | POA: Diagnosis not present

## 2022-03-31 DIAGNOSIS — R278 Other lack of coordination: Secondary | ICD-10-CM

## 2022-03-31 DIAGNOSIS — G8929 Other chronic pain: Secondary | ICD-10-CM

## 2022-03-31 NOTE — Patient Instructions (Addendum)
?  Proper body mechanics with getting out of a chair to decrease strain  ?on back &pelvic floor  ? ?Avoid holding your breath when ?Getting out of the chair: ? ?Scoot to front part of chair chair ?Heels behind knees, feet are hip width apart, nose over toes  ?Inhale like you are smelling roses ?Exhale to stand  ? ?___ ? ? ?Avoid straining pelvic floor, abdominal muscles , spine  ?Use log rolling technique instead of getting out of bed with your neck or the sit-up  ? ? ? ?Log rolling into and out of bed ? ? ?Log rolling into and out of bed ?If getting out of bed on R side, ?Bent knees, scoot hips/ shoulder to L  ?Raise R arm completely overhead, rolling onto armpit  ?Then lower bent knees to bed to get into complete side lying position  ?Then drop legs off bed, and push up onto R elbow/forearm, and use L hand to push onto the bed ? ?__ ? ?Clam Shell 45 Degrees ? ?Lying with hips and knees bent 45?, one pillow between knees and ankles. Heel together, toes apart like ballerina,  Lift knee with exhale while pressing heels together. Be sure pelvis does not roll backward. Do not arch back. ?Do 20 times, each leg, 2 times per day. ? ? ? ?Complimentary stretch: ?Figure-4  ? ?Cross thigh over thigh  ? ?5breaths   ? ?___ ?OPen book ( no elbow circles)  ? ?___ ? ?___ ? ?

## 2022-03-31 NOTE — Therapy (Signed)
[place] ?[facility] ?[address] ?[place], [place], [address] ?Phone: [phone]   Fax:  [phone] ? ?Physical Therapy Treatment ? ?Patient Details  ?Name: Debbie Newman ?MRN: [id number] ?Date of Birth: 10-13-1961 ?Referring Provider (PT): Dalbert Garnet ? ? ?Encounter Date: 03/31/2022 ? ? PT End of Session - 03/31/22 1130   ? ? Visit Number 3   ? Date for PT Re-Evaluation 05/13/22   ? PT Start Time 1100   ? PT Stop Time 1200   ? PT Time Calculation (min) 60 min   ? Activity Tolerance Patient tolerated treatment well   ? ?  ?  ? ?  ? ? ?Past Medical History:  ?Diagnosis Date  ? Female bladder prolapse 2013  ? Hiatal hernia 2013  ? ? ?Past Surgical History:  ?Procedure Laterality Date  ? CESAREAN SECTION  1999  ? CHOLECYSTECTOMY N/A 07/03/2015  ? Procedure: LAPAROSCOPIC CHOLECYSTECTOMY WITH INTRAOPERATIVE CHOLANGIOGRAM;  Surgeon: Nadeen Landau, MD;  Location: [facility] ORS;  Service: General;  Laterality: N/A;  ? TONSILLECTOMY    ? ? ?There were no vitals filed for this visit. ? ? Subjective Assessment - 03/31/22 1108   ? ? Subjective Pt reports L hip strated hurting after last session with stairs and walking. There was achiness 10/10 for one week. Pt also had radiating pain to midlateral thigh on L. Today , pain is 3/10 and does not have radiating pain.  The open book exericse causes R shoulder pain.   ? Pertinent History C section x 1, Vaginal deliveries x 2 , gall bladder removal.  Fitness : rides a stationery bike 15 min, stretching routine before riding,   ? ?  ?  ? ?  ? ? ? ? ? [facility] PT Assessment - 03/31/22 1256   ? ?  ? Observation/Other Assessments  ? Observations pt performed open book exericse with incorrect technique explains why she reported R shoulder pain with exericse. Pt had no R shouler pain with correct technique.   ?  ? Palpation  ? SI assessment  hypomobile at R SIJ , B attachments at coccygeus and by [facility] ( limited hip flex/ abd ER)   ? ?  ?  ? ?   ? ? ? ? ? ? ? ? ? ? ? ? ? ? ? ? [facility] Adult PT Treatment/Exercise - 03/31/22 1257   ? ?  ? Neuro Re-ed   ? Neuro Re-ed Details  cued for clam shells, figure 4 stretch, corrected her form in open book   ?  ? Modalities  ? Modalities Moist Heat   ?  ? Moist Heat Therapy  ? Number Minutes Moist Heat 5 Minutes   ? Moist Heat Location --   sacrum, hips in support butterfly pose to promote hip mobility  ?  ? Manual Therapy  ? Manual therapy comments STM/MWM at pelvic girdle, knee and lateral leg and ankle B to promote more hip flex/ abd/ ER   ? ?  ?  ? ?  ? ? ? ? ? ? ? ? ? ? ? ? ? ? ? PT Long Term Goals - 03/04/22 1327   ? ?  ? PT LONG TERM GOAL #1  ? Title Pt will demo proper coordination of deep core coordination promote improved position of bladder and to urinate with less difficulty   ? Time 4   ? Period Weeks   ? Status New   ? Target Date 04/01/22   ?  ?  PT LONG TERM GOAL #2  ? Title Pt will demo levelled pelvic and shoulder alignment ( R side lowered) and improved recpirocal gait pattern in order to advance to deep core exericses and decrease CLBP   ? Time 2   ? Period Weeks   ? Status New   ? Target Date 03/18/22   ?  ? PT LONG TERM GOAL #3  ? Title Pt will improve FOTO score for LBP score will have  5 pt change  ( 59pt ) in order to demo improved functional mobility   ? Time 10   ? Period Weeks   ? Status New   ? Target Date 05/13/22   ?  ? PT LONG TERM GOAL #4  ? Title Pt will report completing urination 100% of the time in order to minimize UTI and promote hygience   ? Time 8   ? Period Weeks   ? Target Date 04/29/22   ? ?  ?  ? ?  ? ? ? ? ? ? ? ? Plan - 03/31/22 1131   ? ? Clinical Impression Statement Pt idemo'd improved structural alignment of spine and pelvic girdle but required manual Tx to promote more hip / SIJ mobility. Pt demo'd improved AROM with hip flex/ abd/ flexion post Tx. Plan to assess pelvic floor next session.   ? Examination-Activity Limitations Toileting;Locomotion Level   ?  Stability/Clinical Decision Making Evolving/Moderate complexity   ? Rehab Potential Good   ? PT Frequency 1x / week   ? PT Duration Other (comment)   10  ? PT Treatment/Interventions Gait training;Stair training;Functional mobility training;Therapeutic activities;Patient/family education;Therapeutic exercise;Manual techniques;Neuromuscular re-education;Balance training;Scar mobilization;Taping;Cryotherapy;ADLs/Self Care Home Management;Moist Heat;Dry needling   ? Consulted and Agree with Plan of Care Patient   ? ?  ?  ? ?  ? ? ?Patient will benefit from skilled therapeutic intervention in order to improve the following deficits and impairments:  Decreased activity tolerance, Decreased coordination, Decreased endurance, Decreased strength, Decreased range of motion, Impaired flexibility, Hypomobility, Decreased scar mobility, Decreased mobility, Abnormal gait, Increased muscle spasms, Increased fascial restricitons, Improper body mechanics, Pain, Postural dysfunction, Decreased knowledge of use of DME, Decreased balance ? ?Visit Diagnosis: ?Muscle weakness (generalized) ? ?Chronic bilateral low back pain with left-sided sciatica ? ?Other abnormalities of gait and mobility ? ?Other lack of coordination ? ?Chronic pain of right knee ? ? ? ? ?Problem List ?There are no problems to display for this patient. ? ? ?Mariane Masters, PT ?03/31/2022, 1:00 PM ? ?[place] ?[facility] ?[address] ?[place], [place], [address] ?Phone: [phone]   Fax:  [phone] ? ?Name: Debbie Newman ?MRN: [id number] ?Date of Birth: 01/04/1961 ? ? ? ?

## 2022-04-01 ENCOUNTER — Ambulatory Visit: Payer: BC Managed Care – PPO | Admitting: Occupational Therapy

## 2022-04-01 ENCOUNTER — Encounter: Payer: Self-pay | Admitting: Occupational Therapy

## 2022-04-01 DIAGNOSIS — G8929 Other chronic pain: Secondary | ICD-10-CM

## 2022-04-01 DIAGNOSIS — M25531 Pain in right wrist: Secondary | ICD-10-CM

## 2022-04-01 DIAGNOSIS — M25631 Stiffness of right wrist, not elsewhere classified: Secondary | ICD-10-CM

## 2022-04-01 DIAGNOSIS — M6281 Muscle weakness (generalized): Secondary | ICD-10-CM

## 2022-04-01 DIAGNOSIS — R278 Other lack of coordination: Secondary | ICD-10-CM

## 2022-04-02 NOTE — Therapy (Signed)
[place] ?[facility] ?[address] ?[place], [place], [address] ?Phone: [phone]   Fax:  [phone] ? ?Occupational Therapy Treatment ? ?Patient Details  ?Name: Debbie Newman ?MRN: [id number] ?Date of Birth: [birth year] ?Referring Provider (OT): Marney Doctor ? ? ?Encounter Date: 04/01/2022 ? ? OT End of Session - 04/02/22 1437   ? ? Visit Number 11   ? Number of Visits 15   ? Date for OT Re-Evaluation 04/13/22   ? OT Start Time 1615   ? OT Stop Time 1706   ? OT Time Calculation (min) 51 min   ? Activity Tolerance Patient tolerated treatment well   ? Behavior During Therapy [facility] for tasks assessed/performed   ? ?  ?  ? ?  ? ? ?Past Medical History:  ?Diagnosis Date  ? Female bladder prolapse 2013  ? Hiatal hernia 2013  ? ? ?Past Surgical History:  ?Procedure Laterality Date  ? CESAREAN SECTION  1999  ? CHOLECYSTECTOMY N/A 07/03/2015  ? Procedure: LAPAROSCOPIC CHOLECYSTECTOMY WITH INTRAOPERATIVE CHOLANGIOGRAM;  Surgeon: Nadeen Landau, MD;  Location: [facility] ORS;  Service: General;  Laterality: N/A;  ? TONSILLECTOMY    ? ? ?There were no vitals filed for this visit. ? ? Subjective Assessment - 04/01/22 1615   ? ? Subjective  Pt reports pain has improved, only 1/10.  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  He recently got a new mattress so she doesn't have to flip it any longer but still flips the mattress topper which is very light weight.   ? 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 They were last evaluated by Horris Latino, PA on 01/15/2022. At that time, the plan was to perform a right wrist MRI arthrogram, use a short arm wrist brace, then follow-up after MRI. She had her MRI on 01/27/2022. 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 She was recommended follow-up with reevaluation and consideration of diagnostic/therapeutic injection to help determine her primary pain generator   ? Patient Stated Goals Would like my pain in my wrist to be better to be able to use it more without wearing splint all the time   ? Currently in Pain? Yes   ? Pain Score 1    ? Pain Orientation Right   ? Pain Descriptors / Indicators Aching   ? Pain Type Acute pain   ? Pain Onset More than a month ago   ? Pain Frequency Intermittent   ? ?  ?  ? ?  ? ?Pt reports overall decreased pain in right hand over the last week, no pain at rest most of the day, some mild pain 1/10 with use.   ? ?Active ROM of wrist WNL, pain noted when wrist moves into flexion/ext if fingers are closed even if fist is not tight, if fingers open, no pain.   ?Slightly tender over FCR  ? ?Pt has been weaning off splint over the last few days, just wearing when working and engaged in tasks.  Wears soft neoprene with light activities.   ? ?Pt seen for A/AAROM of wrist in all  planes of motion.  Added isometric exercises this date to wrist for flex/ext, ulnar and radial deviation with therapist providing manual resist as well as against the table and demo of how patient can perform at home self directed.   ? 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 ? ? ? ? ? ? ? ? ? ? ? OT Treatments/Exercises (OP) - 04/02/22 1456   ? ?  ?   ?    ?  ? Iontophoresis  ? Type of Iontophoresis Dexamethasone   ? Location FCU R at wrist   ? Dose med patch, 1.2 current   ? Time 19   ?  ?   ?    ? ?  ?  ? ?  ? ? ? ? ? ? ? ? ? OT Education - 04/02/22 1437   ? ? Education Details HEP, pain, splint use   ? Person(s) Educated Patient   ? Methods Explanation;Demonstration;Tactile cues;Verbal  cues;Handout   ? Comprehension Verbal cues required;Returned demonstration;Verbalized understanding   ? ?  ?  ? ?  ? ? ? ? ? ? OT Long Term Goals - 03/02/22 1728   ? ?  ? OT LONG TERM GOAL #1  ? Title Pt to be independent in HEP to increase  R wrist AROM to WNL to wear splint 50% of time and pain less than 2/10   ? Baseline wrist splint more than 85% of time - pain 2-8/10 and decrease wrist AROM in all planes  STill wearing splint most all time , increase ROM - but pain still 1-3/10 with AROM -shooting pain at times   ? Time 4   ? Period Weeks   ? Status On-going   ? Target Date 03/30/22   ?  ? OT LONG TERM GOAL #2  ? Title Pt to verbalize and demo 3 joint protection or modifications /AE to use to decrease pain in R wrist and ease of doing tasks in ADL's and IADL's   ? Status Achieved   ?  ? OT LONG TERM GOAL #3  ? Title Pt show show increase grip and prenhension by 1-3 lbs without pain to wear splint less than 50% of time or more wrist wrap   ? Baseline 85% or more in wrist splint -pain 2-8/10 with use - grip R 44,L 45 ,  3 point 11 lbs , lat grip R 11 , L 10 lbs  NOW pain still 2-3/10 assessment better, splint same wearing , grip and prehension same   ? Time 6   ? Period Weeks   ? Status On-going   ? Target Date 04/13/22   ? ?  ?  ? ?  ? ? ? ? ? ? ? ? Plan - 04/02/22 1438   ? ? Clinical Impression Statement Pt report having pain in R wrist for a while and wearing wrist splint since Nov 2022 - She had her MRI on 01/27/2022. 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 She had on 02/01/22 diagnostic/therapeutic injection to help determine her primary pain generator. Pt also referred to OT to decrease pain and increase AROM and strength for functional use-  NOW since evaluation patient show active range  of motion at right wrist within normal limits and no pain.  Grip and prehension strength with  no pain increase.  Patient reports wearing a wrist immobilization splint a portion of the day when she is engaged in heavier work tasks, otherwise she has been trying to wean down and has been wearing Benik neoprene wrist wrap when pain-free.Marland Kitchen Pt?s pain reported as 1/10 this date with activity and none at rest.  Pt did indicate some pain with wrist flex/ext if fingers are closed versus when they are open.  Pt to keep pain under 2/10 -patient continue to benefit from OT skilled services to decrease pain increase range of motion and strength without increased symptoms.   ? OT Occupational Profile and History Problem Focused Assessment - Including review of records relating to presenting problem   ? Occupational performance deficits (Please refer to evaluation for details): ADL's;IADL's;Work;Play;Leisure;Social Participation   ? Body Structure / Function / Physical Skills ADL;Decreased knowledge of use of DME;Strength;Pain;UE functional use;ROM;IADL;Flexibility   ? Rehab Potential Good   ? Clinical Decision Making Limited treatment options, no task modification necessary   ? Comorbidities Affecting Occupational Performance: May have comorbidities impacting occupational performance   ? Modification or Assistance to Complete Evaluation  No modification of tasks or assist necessary to complete eval   ? OT Frequency 2x / week   ? OT Duration 4 weeks   ? OT Treatment/Interventions Self-care/ADL training;Paraffin;Fluidtherapy;Contrast Bath;Therapeutic exercise;Patient/family education;Manual Therapy;Passive range of motion;Splinting;DME and/or AE instruction;Iontophoresis   ? Consulted and Agree with Plan of Care Patient   ? ?  ?  ? ?  ? ? ?Patient will benefit from skilled therapeutic intervention in order to improve the following deficits and impairments:   ?Body Structure / Function / Physical Skills: ADL, Decreased knowledge of  use of DME, Strength, Pain, UE functional use, ROM, IADL, Flexibility ?  ?  ? ? ?Visit Diagnosis: ?Muscle weakness (generalized) ? ?Other lack of coordination ? ?Chronic pain of right knee ? ?Pain in right wrist ?

## 2022-04-06 ENCOUNTER — Ambulatory Visit: Payer: BC Managed Care – PPO | Admitting: Physical Therapy

## 2022-04-06 DIAGNOSIS — G8929 Other chronic pain: Secondary | ICD-10-CM

## 2022-04-06 DIAGNOSIS — R278 Other lack of coordination: Secondary | ICD-10-CM

## 2022-04-06 DIAGNOSIS — M25531 Pain in right wrist: Secondary | ICD-10-CM | POA: Diagnosis not present

## 2022-04-06 DIAGNOSIS — M6281 Muscle weakness (generalized): Secondary | ICD-10-CM

## 2022-04-06 NOTE — Therapy (Addendum)
[place] ?[facility] ?[address] ?[place], [place], [address] ?Phone: [phone]   Fax:  [phone] ? ?Physical Therapy Treatment ? ?Patient Details  ?Name: Debbie Newman ?MRN: [id number] ?Date of Birth: Aug 15, 1961 ?Referring Provider (PT): Dalbert Garnet ? ? ?Encounter Date: 04/06/2022 ? ? PT End of Session - 04/06/22 0953   ? ? Visit Number 4   ? Date for PT Re-Evaluation 05/13/22   ? PT Start Time 1004   ? PT Stop Time 1103   ? PT Time Calculation (min) 59 min   ? Activity Tolerance Patient tolerated treatment well   ? ?  ?  ? ?  ? ? ?Past Medical History:  ?Diagnosis Date  ? Female bladder prolapse 2013  ? Hiatal hernia 2013  ? ? ?Past Surgical History:  ?Procedure Laterality Date  ? CESAREAN SECTION  1999  ? CHOLECYSTECTOMY N/A 07/03/2015  ? Procedure: LAPAROSCOPIC CHOLECYSTECTOMY WITH INTRAOPERATIVE CHOLANGIOGRAM;  Surgeon: Nadeen Landau, MD;  Location: [facility] ORS;  Service: General;  Laterality: N/A;  ? TONSILLECTOMY    ? ? ?There were no vitals filed for this visit. ? ? Subjective Assessment - 04/06/22 1005   ? ? Subjective Pt reported she is not sure if she is doing her exercises correctly.   ? Pertinent History C section x 1, Vaginal deliveries x 2 , gall bladder removal.  Fitness : rides a stationery bike 15 min, stretching routine before riding,   ? ?  ?  ? ?  ? ? ? ? ? [facility] PT Assessment - 04/06/22 0944   ? ?  ? Observation/Other Assessments  ? Observations poor technique with clam shells   ?  ? Sit to Stand  ? Comments less genu valgus and pt reported no knee pain after Tx   ?  ? Palpation  ? Palpation comment tightness at hamstring, ITband, lateral leg/ ankle on R  ( post Tx: improved figure 4 AROM)   ? ?  ?  ? ?  ? ? ? ? ? ? ? ? ? ? ? ? ? ? ? ? [facility] Adult PT Treatment/Exercise - 04/06/22 0945   ? ?  ? Neuro Re-ed   ? Neuro Re-ed Details  cued for  with strap,  band and figure -4 stretches , cued for depe core coordinaiton with clam shells to optimize  pelvic function  and pelvic organ support   ?  ? Modalities  ? Modalities Moist Heat   ?  ? Moist Heat Therapy  ? Number Minutes Moist Heat 5 Minutes   ? Moist Heat Location --   hip/ sacrum R, in lower trunk rotation to lengthen IT band and then in supine with support of pillow under knees, guided relaxation  ?  ? Manual Therapy  ? Manual therapy comments STM/MWM at problem areas  to promote more R hip ex/ abd/ ER   ? ?  ?  ? ?  ? ? ? ? ? ? ? ? ? ? ? ? ? ? ? PT Long Term Goals - 03/31/22 1331   ? ?  ? PT LONG TERM GOAL #1  ? Title Pt will demo proper coordination of deep core coordination promote improved position of bladder and to urinate with less difficulty   ? Time 4   ? Period Weeks   ? Status On-going   ? Target Date 04/01/22   ?  ? PT LONG TERM GOAL #2  ? Title Pt will demo  levelled pelvic and shoulder alignment ( R side lowered) and improved recpirocal gait pattern in order to advance to deep core exericses and decrease CLBP   ? Time 2   ? Period Weeks   ? Status On-going   ? Target Date 03/18/22   ?  ? PT LONG TERM GOAL #3  ? Title Pt will improve FOTO score for LBP score will have  5 pt change  ( 59pt ) in order to demo improved functional mobility   ? Time 10   ? Period Weeks   ? Status On-going   ? Target Date 05/13/22   ?  ? PT LONG TERM GOAL #4  ? Title Pt will report completing urination 100% of the time in order to minimize UTI and promote hygience   ? Time 8   ? Period Weeks   ? Status On-going   ? Target Date 04/29/22   ?  ? PT LONG TERM GOAL #5  ? Title Pt will increase  hip abduction 3/5 B to > 4/5 in order to minimize LBP and improve pelvic dysfunctions   ? Time 8   ? Period Weeks   ? Status New   ? Target Date 05/26/22   ? ?  ?  ? ?  ? ? ? ? ? ? ? ? Plan - 04/06/22 1001   ? ? Clinical Impression Statement Pt required review of past HEP with cues. Advanced pt to R hamstring, ITband stretch to promote more R SIJ mobility and AROM with figure-4 stretch. Modified manual techinque when pt c/o pain  with manual Tx which was focused on minimizing tensions for hamstring, ITband, lateral leg RLE. Pt tolerated Tx with modifications to pressure and changed manual technique. ? ?Pt demo'd less genu valgus with sit to stand with attention to technique. Pt reported no R knee pain with sit to stand after Tx which indicates more pelvic stability and awareness of alignment.  ? ?Plan to add deep core HEP at next session. Pt continues to benefit from skilled PT.   ? Examination-Activity Limitations Toileting;Locomotion Level   ? Stability/Clinical Decision Making Evolving/Moderate complexity   ? Rehab Potential Good   ? PT Frequency 1x / week   ? PT Duration Other (comment)   10  ? PT Treatment/Interventions Gait training;Stair training;Functional mobility training;Therapeutic activities;Patient/family education;Therapeutic exercise;Manual techniques;Neuromuscular re-education;Balance training;Scar mobilization;Taping;Cryotherapy;ADLs/Self Care Home Management;Moist Heat;Dry needling   ? Consulted and Agree with Plan of Care Patient   ? ?  ?  ? ?  ? ? ?Patient will benefit from skilled therapeutic intervention in order to improve the following deficits and impairments:  Decreased activity tolerance, Decreased coordination, Decreased endurance, Decreased strength, Decreased range of motion, Impaired flexibility, Hypomobility, Decreased scar mobility, Decreased mobility, Abnormal gait, Increased muscle spasms, Increased fascial restricitons, Improper body mechanics, Pain, Postural dysfunction, Decreased knowledge of use of DME, Decreased balance ? ?Visit Diagnosis: ?Muscle weakness (generalized) ? ?Other lack of coordination ? ?Chronic pain of right knee ? ? ? ? ?Problem List ?There are no problems to display for this patient. ? ? ?Mariane Masters, PT ?04/06/2022, 10:05 AM ? ?[place] ?[facility] ?[address] ?[place], [place], [address] ?Phone: [phone]   Fax:   [phone] ? ?Name: Debbie Newman ?MRN: [id number] ?Date of Birth: 1961/11/27 ? ? ? ?

## 2022-04-06 NOTE — Patient Instructions (Signed)
Stretches : (Cuing provided for proper alignment)  ?Instructions start with Strap on R  ? ? ?Stretches for your legs: ?LAYING on Back ?Use upper arms and elbows for stability when pulling strap ?Opposite knee bent and foot firm in align with hip  ? ?Strap on ballmound: ? ?Hip socket  ?_strap, L knee bent, R ballmound against strap and spread toes, ?rolling foot 15 deg out and in across midline.  10 reps each side  ? ?Hamstring ?_knee bends ? ? ?  10 reps with knee pointing out towards armpit ( notice the stretch in the medial hamstring muscle)  ? ? ? ?IT band  ?_scoot hips to R, cross R leg over L and straighten knee with strap on ballmound,  ? ?Bend knee back and forth 5x ? ? ?Strap under R thigh ?Hip abductors ( figure 4)   ?   ,R ankle over L  thigh ( stretching L glut)  ?   5  Breaths  ? ?

## 2022-04-13 ENCOUNTER — Ambulatory Visit: Payer: BC Managed Care – PPO | Admitting: Occupational Therapy

## 2022-04-13 ENCOUNTER — Encounter: Payer: Self-pay | Admitting: Occupational Therapy

## 2022-04-13 DIAGNOSIS — M25631 Stiffness of right wrist, not elsewhere classified: Secondary | ICD-10-CM

## 2022-04-13 DIAGNOSIS — M25531 Pain in right wrist: Secondary | ICD-10-CM | POA: Diagnosis not present

## 2022-04-13 DIAGNOSIS — G8929 Other chronic pain: Secondary | ICD-10-CM

## 2022-04-13 DIAGNOSIS — M6281 Muscle weakness (generalized): Secondary | ICD-10-CM

## 2022-04-13 NOTE — Therapy (Signed)
[facility] ?[address] ?[place], [place], [address] ?Phone: [phone]   Fax:  [phone] ? ?Occupational Therapy Treatment ? ?Patient Details  ?Name: Debbie Newman ?MRN: [id number] ?Date of Birth: 18-Aug-1961 ?Referring Provider (OT): Mikle Bosworth ? ? ?Encounter Date: 04/13/2022 ? ? OT End of Session - 04/13/22 [id number]   ? ? Visit Number 12   ? Number of Visits 16   ? Date for OT Re-Evaluation 05/25/22   ? OT Start Time [phone]   ? OT Stop Time [phone]   ? OT Time Calculation (min) 45 min   ? Activity Tolerance Patient tolerated treatment well   ? Behavior During Therapy [facility] for tasks assessed/performed   ? ?  ?  ? ?  ? ? ?Past Medical History:  ?Diagnosis Date  ? Female bladder prolapse 2013  ? Hiatal hernia 2013  ? ? ?Past Surgical History:  ?Procedure Laterality Date  ? [place]  ? CHOLECYSTECTOMY N/A 07/03/2015  ? Procedure: LAPAROSCOPIC CHOLECYSTECTOMY WITH INTRAOPERATIVE CHOLANGIOGRAM;  Surgeon: Leonie Green, MD;  Location: [facility] ORS;  Service: General;  Laterality: N/A;  ? TONSILLECTOMY    ? ? ?There were no vitals filed for this visit. ? ? Subjective Assessment - 04/13/22 1008   ? ? 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  I am trying to use my hand smaller.  It is more my ulnar side at that bone most of my pain thumb side is much better.  It was good but then I did Sunday and that is when I had some pain lingering until today.   ? 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 They were last evaluated by Cameron Proud, PA on 01/15/2022. At that time, the plan was to perform a right wrist MRI arthrogram, use a short arm wrist brace, then follow-up after MRI. She had her MRI on 01/27/2022. 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 She was recommended follow-up with reevaluation and consideration of diagnostic/therapeutic injection to help determine her primary pain generator   ? Patient Stated Goals Would like my pain in my wrist to be better to be able to use it more without wearing splint all the time   ? Currently in Pain? Yes   ? Pain Score 3    ? Pain Location Wrist   ? Pain Orientation --   ulnar side  ? Pain Descriptors / Indicators Aching   ? Pain Type Chronic pain   ? Pain Onset More than a month ago   ? Pain Frequency Intermittent   ? ?  ?  ? ?  ? ? ? ? ? [place] OT Assessment - 04/13/22 0001   ? ?  ? Strength  ? Right Hand Grip (lbs) 41   ? Right Hand Lateral Pinch 11 lbs   ? Right Hand 3 Point Pinch 11 lbs   ? Left Hand Grip (lbs) 40   ? Left Hand Lateral Pinch 11 lbs   ? Left Hand 3 Point Pinch 11 lbs   ? ?  ?  ? ?  ? ? ?  Patient arrived this date with reports of pain much better at right wrist do report since Sunday some pain at ulnar side 3/10.  Radial site less than 1/10 pain.   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   ?Patient's active range of motion at right wrist within normal limits this date -with endrange wrist extension 3/10 on ulnar wrist.  Cont to be tender over FCR 1/10. ?With 1/10 pain with ulnar deviation on radial wrist. ?  ?  Do reports she sometimes wears Benik neoprene wrist wrap/ or no splint b most of the time.  She do  admit not doing contrast.  After contrast patient was pain-free and active range of motion end ranges.  No pain with supination pronation with resistance or after contrast in all planes of wrist motion no pain.  ?Encourage patient to continue with joint protection principles as  well as contrast. ?  ?  ?AAROM and AROM for wrist pain free range in all planes ?Patient continues to be tender over FCR ?Patient to hold off on isometric strengthening exercises.  ?  ?  ?  ?  ?  ?  ?  ?  ?  Skin check done prior to ionto-patient   tolerating well - l patient to keep patch on for hour afterwards done ninth session of ionto today. ? ? ? ? ? ? ? OT Treatments/Exercises (OP) - 04/13/22 0001   ? ?  ? Iontophoresis  ? Type of Iontophoresis Dexamethasone   ? Location FCU R at wrist   ? Dose med patch, 2.0 current   ? Time 19   ?  ? RUE Contrast Bath  ? Time 8 minutes   ? Comments decrease pain at wrist - AROM afterwards   ? ?  ?  ? ?  ? ? ? ? ? ? ? ? ? OT Education - 04/13/22 0928   ? ? Education Details HEP, pain, splint use   ? Person(s) Educated Patient   ? Methods Explanation;Demonstration;Tactile cues;Verbal cues;Handout   ? Comprehension Verbal cues required;Returned demonstration;Verbalized understanding   ? ?  ?  ? ?  ? ? ? ? ? ? OT Long Term Goals - 04/13/22 1013   ? ?  ? OT LONG TERM GOAL #1  ? Title Pt to be independent in HEP to increase  R wrist AROM to WNL to wear splint 50% of time and pain less than 2/10   ? Baseline wrist splint more than 85% of time - pain 2-8/10 and decrease wrist AROM in all planes   Wearing splin t25% of time , increase AROM WNL pain  1-3/10 with AROM   ? Time 4   ? Period Weeks   ? Status On-going   ? Target Date 05/11/22   ?  ? OT LONG TERM GOAL #2  ? Title Pt to verbalize and demo 3 joint protection or modifications /AE to use to decrease pain in R wrist and ease of doing tasks in ADL's and IADL's   ? Status Achieved   ?  ? OT LONG TERM GOAL #3  ? Title Pt show show increase grip and prenhension by 1-3 lbs without pain to wear splint less than 50% of time or more wrist wrap   ? Baseline 85% or more in wrist splint -pain 2-8/10 with use - grip R 44,L 45 ,  3 point 11 lbs , lat grip  R 11 , L 10 lbs  NOW  no pain with grip or prehension - and and splint less than 25 %  of the time   ? Status Achieved   ? ?  ?  ? ?  ? ? ? ? ? ? ? ? Plan - 04/13/22 0933   ? ? Clinical Impression Statement Pt report having pain in R wrist for a while and wearing wrist splint since Nov 2022 - She had her MRI on 01/27/2022. 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 She had on 02/01/22 diagnostic/therapeutic injection to help determine her primary pain generator. Pt also referred to OT to decrease pain and increase AROM and strength for functional use-  NOW since evaluation patient show active range of motion at right wrist within normal limits and some pain this date mostly on ulnar wrist and tenderness over FCR , but cold be area of cyst too. .  Grip and prehension strength WNL and no pain.  Patient reports wearing a wrist immobilization splint a portion of the day when she is engaged in heavier work tasks that is about 25% of the time. Wearing some Benik neoprene wrist wrap. Pt?s pain reported as 3/10 this date  at ulnar wrist coming in since Sunday at end range but 1/10 FCR - but not at rest. Pt to cont with joint protection and modifications -will cont 1 x wk for 1-2 wks and then biweekly for 2 visits to monitor if she can maintain progress. Pt to keep pain under 2/10 -patient continue to benefit from OT skilled services to decrease pain increase range of motion and strength without increased symptoms.   ? OT Occupational Profile and History Problem Focused Assessment - Including review of records relating to presenting problem   ? Occupational performance deficits (Please refer to evaluation for details): ADL's;IADL's;Work;Play;Leisure;Social Participation   ? Body Structure / Function / Physical Skills ADL;Decreased knowledge of use of DME;Strength;Pain;UE functional  use;ROM;IADL;Flexibility   ? Rehab Potential Good   ? Clinical Decision Making Limited treatment options, no task modification necessary   ? Comorbidities Affecting Occupational Performance: May have comorbidities impa

## 2022-04-15 ENCOUNTER — Ambulatory Visit: Payer: BC Managed Care – PPO | Admitting: Physical Therapy

## 2022-04-16 ENCOUNTER — Ambulatory Visit: Payer: BC Managed Care – PPO | Admitting: Physical Therapy

## 2022-04-16 DIAGNOSIS — R278 Other lack of coordination: Secondary | ICD-10-CM

## 2022-04-16 DIAGNOSIS — G8929 Other chronic pain: Secondary | ICD-10-CM

## 2022-04-16 DIAGNOSIS — M6281 Muscle weakness (generalized): Secondary | ICD-10-CM

## 2022-04-16 DIAGNOSIS — R2689 Other abnormalities of gait and mobility: Secondary | ICD-10-CM

## 2022-04-16 DIAGNOSIS — M25531 Pain in right wrist: Secondary | ICD-10-CM | POA: Diagnosis not present

## 2022-04-16 NOTE — Therapy (Signed)
[place] [facility] ?[address] ?[place], [place], [address] ?Phone: [phone]   Fax:  [phone] ? ?Physical Therapy Treatment ? ?Patient Details  ?Name: Debbie Newman ?MRN: [id number] ?Date of Birth: 09/25/1961 ?Referring Provider (PT): Dalbert Garnet ? ? ?Encounter Date: 04/16/2022 ? ? PT End of Session - 04/16/22 1009   ? ? Visit Number 5   ? Date for PT Re-Evaluation 05/13/22   ? PT Start Time 1005   ? PT Stop Time 1105   ? PT Time Calculation (min) 60 min   ? Activity Tolerance Patient tolerated treatment well   ? ?  ?  ? ?  ? ? ?Past Medical History:  ?Diagnosis Date  ? Female bladder prolapse 2013  ? Hiatal hernia 2013  ? ? ?Past Surgical History:  ?Procedure Laterality Date  ? CESAREAN SECTION  1999  ? CHOLECYSTECTOMY N/A 07/03/2015  ? Procedure: LAPAROSCOPIC CHOLECYSTECTOMY WITH INTRAOPERATIVE CHOLANGIOGRAM;  Surgeon: Nadeen Landau, MD;  Location: [facility] ORS;  Service: General;  Laterality: N/A;  ? TONSILLECTOMY    ? ? ?There were no vitals filed for this visit. ? ? Subjective Assessment - 04/16/22 1007   ? ? Subjective Pt reported 90% improvement with leakage. Her husband notices prolapse is not as low.  Pt reported 50% improved with R knee   ? Pertinent History C section x 1, Vaginal deliveries x 2 , gall bladder removal.  Fitness : rides a stationery bike 15 min, stretching routine before riding,   ? ?  ?  ? ?  ? ? ? ? ? [facility] PT Assessment - 04/16/22 1307   ? ?  ? Posture/Postural Control  ? Posture Comments standing with yperextended knees, posterior COM   ? ?  ?  ? ?  ? ? ? ? ? ? ? ? ? ? ? ? ? Pelvic Floor Special Questions - 04/16/22 1304   ? ? Prolapse Anterior Wall   standing: inside introitus, at border,  ? Pelvic Floor Internal Exam pt consented verbally and had no contraindications   ? Exam Type Vaginal   ? Palpation tightness and tenderness at pubic symphysis attachments B ,   ? Strength weak squeeze, no lift   ? ?  ?  ? ?  ? ? ? ? [facility] Adult PT  Treatment/Exercise - 04/16/22 1305   ? ?  ? Neuro Re-ed   ? Neuro Re-ed Details  excessive cues for anterior tilt of pelvic floor , coordinatin of pelvic floor with deep core , cued for standing posture with less hyperextended knees   ?  ? Moist Heat Therapy  ? Number Minutes Moist Heat 5 Minutes   ? Moist Heat Location --   through sheet, perineum in butterfly pose to promote lengthening of pelvic floor  ?  ? Manual Therapy  ? Internal Pelvic Floor STM/MWM at problem areas noted in assessment to promote upward position of bladder and coordination of pelvic floor   ? ?  ?  ? ?  ? ? ? ? ? ? ? ? ? ? ? ? ? ? ? PT Long Term Goals - 03/31/22 1331   ? ?  ? PT LONG TERM GOAL #1  ? Title Pt will demo proper coordination of deep core coordination promote improved position of bladder and to urinate with less difficulty   ? Time 4   ? Period Weeks   ? Status On-going   ? Target Date 04/01/22   ?  ?  PT LONG TERM GOAL #2  ? Title Pt will demo levelled pelvic and shoulder alignment ( R side lowered) and improved recpirocal gait pattern in order to advance to deep core exericses and decrease CLBP   ? Time 2   ? Period Weeks   ? Status On-going   ? Target Date 03/18/22   ?  ? PT LONG TERM GOAL #3  ? Title Pt will improve FOTO score for LBP score will have  5 pt change  ( 59pt ) in order to demo improved functional mobility   ? Time 10   ? Period Weeks   ? Status On-going   ? Target Date 05/13/22   ?  ? PT LONG TERM GOAL #4  ? Title Pt will report completing urination 100% of the time in order to minimize UTI and promote hygience   ? Time 8   ? Period Weeks   ? Status On-going   ? Target Date 04/29/22   ?  ? PT LONG TERM GOAL #5  ? Title Pt will increase  hip abduction 3/5 B to > 4/5 in order to minimize LBP and improve pelvic dysfunctions   ? Time 8   ? Period Weeks   ? Status New   ? Target Date 05/26/22   ? ?  ?  ? ?  ? ? ? ? ? ? ? ? Plan - 04/16/22 1009   ? ? Clinical Impression Statement Pt is making improvements with report of  90%improved continence.  ? ?Assessed prolapse today in standing and hooklying. Provided manual Tx to promote decreased tenderness and tightness to mm at anterior pelvic floor. Pt demo'd more upward position of bladder and improved contraction of pelvic floor post Tx.  ? ?Provided excessive cues for anterior tilt of pelvic floor , coordinatin of pelvic floor with deep core , cued for standing posture with less hyperextended knees . ? ? Plan to progress pt to endurance contractions of kegel program at upcoming session. Pt continues to benefit from skilled PT  ? Examination-Activity Limitations Toileting;Locomotion Level   ? Stability/Clinical Decision Making Evolving/Moderate complexity   ? Rehab Potential Good   ? PT Frequency 1x / week   ? PT Duration Other (comment)   10  ? PT Treatment/Interventions Gait training;Stair training;Functional mobility training;Therapeutic activities;Patient/family education;Therapeutic exercise;Manual techniques;Neuromuscular re-education;Balance training;Scar mobilization;Taping;Cryotherapy;ADLs/Self Care Home Management;Moist Heat;Dry needling   ? Consulted and Agree with Plan of Care Patient   ? ?  ?  ? ?  ? ? ?Patient will benefit from skilled therapeutic intervention in order to improve the following deficits and impairments:  Decreased activity tolerance, Decreased coordination, Decreased endurance, Decreased strength, Decreased range of motion, Impaired flexibility, Hypomobility, Decreased scar mobility, Decreased mobility, Abnormal gait, Increased muscle spasms, Increased fascial restricitons, Improper body mechanics, Pain, Postural dysfunction, Decreased knowledge of use of DME, Decreased balance ? ?Visit Diagnosis: ?Muscle weakness (generalized) ? ?Chronic pain of right knee ? ?Other lack of coordination ? ?Chronic bilateral low back pain with left-sided sciatica ? ?Other abnormalities of gait and mobility ? ? ? ? ?Problem List ?There are no problems to display for this  patient. ? ? ?Mariane Masters, PT ?04/16/2022, 1:08 PM ? ?[place] ?[facility] ?[address] ?[place], [place], [address] ?Phone: [phone]   Fax:  [phone] ? ?Name: Debbie Newman ?MRN: [id number] ?Date of Birth: 1961/11/24 ? ? ? ?

## 2022-04-16 NOTE — Patient Instructions (Addendum)
Pillow under buttockes ? ? ?Find pelvic neutral ( rock back, rock forward, find the middle, reference to feet)  ? ?Deep core level 1 ( inhale, thru nose, exhale thru the nose, gentle squeeze  ?10 reps with the exhale  ?Quick pelvic floor squeeze)  ? ? ?Deep core level 2 ( 6 min) ? ? ?___ ? ? ?Standing  ?Shift toward ballmounds and heels, not locked knees  ? ?___ ? ? ?Appt on May 2@ 9am is cancelled  ? ?See you on May 12 @ Fri 8am  ?

## 2022-04-19 ENCOUNTER — Ambulatory Visit: Payer: BC Managed Care – PPO | Attending: Student | Admitting: Occupational Therapy

## 2022-04-19 DIAGNOSIS — M25531 Pain in right wrist: Secondary | ICD-10-CM | POA: Insufficient documentation

## 2022-04-19 DIAGNOSIS — M5442 Lumbago with sciatica, left side: Secondary | ICD-10-CM | POA: Insufficient documentation

## 2022-04-19 DIAGNOSIS — M6281 Muscle weakness (generalized): Secondary | ICD-10-CM | POA: Insufficient documentation

## 2022-04-19 DIAGNOSIS — M25631 Stiffness of right wrist, not elsewhere classified: Secondary | ICD-10-CM | POA: Insufficient documentation

## 2022-04-19 DIAGNOSIS — R2689 Other abnormalities of gait and mobility: Secondary | ICD-10-CM | POA: Insufficient documentation

## 2022-04-19 DIAGNOSIS — R278 Other lack of coordination: Secondary | ICD-10-CM | POA: Insufficient documentation

## 2022-04-19 DIAGNOSIS — M25561 Pain in right knee: Secondary | ICD-10-CM | POA: Insufficient documentation

## 2022-04-19 DIAGNOSIS — G8929 Other chronic pain: Secondary | ICD-10-CM | POA: Insufficient documentation

## 2022-04-20 ENCOUNTER — Encounter: Payer: BC Managed Care – PPO | Admitting: Physical Therapy

## 2022-04-21 ENCOUNTER — Ambulatory Visit: Payer: BC Managed Care – PPO | Admitting: Occupational Therapy

## 2022-04-21 DIAGNOSIS — R278 Other lack of coordination: Secondary | ICD-10-CM | POA: Diagnosis present

## 2022-04-21 DIAGNOSIS — M25531 Pain in right wrist: Secondary | ICD-10-CM | POA: Diagnosis present

## 2022-04-21 DIAGNOSIS — M5442 Lumbago with sciatica, left side: Secondary | ICD-10-CM | POA: Diagnosis present

## 2022-04-21 DIAGNOSIS — M25561 Pain in right knee: Secondary | ICD-10-CM | POA: Diagnosis present

## 2022-04-21 DIAGNOSIS — R2689 Other abnormalities of gait and mobility: Secondary | ICD-10-CM | POA: Diagnosis present

## 2022-04-21 DIAGNOSIS — M6281 Muscle weakness (generalized): Secondary | ICD-10-CM

## 2022-04-21 DIAGNOSIS — M25631 Stiffness of right wrist, not elsewhere classified: Secondary | ICD-10-CM | POA: Diagnosis present

## 2022-04-21 DIAGNOSIS — G8929 Other chronic pain: Secondary | ICD-10-CM | POA: Diagnosis present

## 2022-04-21 NOTE — Therapy (Signed)
[place] ?[facility] ?[address] ?[place], [place], [id number] ?Phone: [phone]   Fax:  [phone] ? ?Occupational Therapy Treatment ? ?Patient Details  ?Name: Debbie Newman ?MRN: [id number] ?Date of Birth: January 24, 1961 ?Referring Provider (OT): Marney DoctorMcGhee ? ? ?Encounter Date: 04/21/2022 ? ? OT End of Session - 04/21/22 1645   ? ? Visit Number 13   ? Number of Visits 16   ? Date for OT Re-Evaluation 05/25/22   ? OT Start Time 1617   ? OT Stop Time 1656   ? OT Time Calculation (min) 39 min   ? Activity Tolerance Patient tolerated treatment well   ? Behavior [facility] for tasks assessed/performed   ? ?  ?  ? ?  ? ? ?Past Medical History:  ?Diagnosis Date  ? Female bladder prolapse 2013  ? Hiatal hernia 2013  ? ? ?Past Surgical History:  ?Procedure Laterality Date  ? CESAREAN SECTION  1999  ? CHOLECYSTECTOMY N/A 07/03/2015  ? Procedure: LAPAROSCOPIC CHOLECYSTECTOMY WITH INTRAOPERATIVE CHOLANGIOGRAM;  Surgeon: Nadeen LandauJarvis Wilton Smith, MD;  Location: [facility] OR[facility];  Service: General;  Laterality: N/A;  ? TONSILLECTOMY    ? ? ?There were no vitals filed for this visit. ? ? Subjective Assessment - 04/21/22 1619   ? ? Subjective  Wrist has been really good since last time.  Is mostly on his bone on the ulnar side that bothers me randomly about maybe 3 nights a week when it swells up really bad.  But I do not know what causing it really the other side the base my thumb is much better maybe 1 time a week to feel bad.   ? 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 They were last evaluated by Horris LatinoLance McGhee, PA on 01/15/2022. At that time, the plan was to perform a right wrist MRI arthrogram, use a short arm wrist brace, then follow-up after MRI. She had her MRI on 01/27/2022. This showed advanced degenerative  changes of the TFCC, advanced articular cartilage thinning at the radiocarpal and intercarpal joints, subchondral cystic change in the lunate/triquetrum/hamate/ulna suspicious for abutment syndrome, small volar wrist ganglion cyst. She was recommended follow-up with reevaluation and consideration of diagnostic/therapeutic injection to help determine her primary pain generator   ? Patient Stated Goals Would like my pain in my wrist to be better to be able to use it more without wearing splint all the time   ? Currently in Pain? Yes   ? Pain Score 1    ? Pain Location Wrist   ? Pain Orientation Right   ? Pain Descriptors / Indicators Tender   ? Pain Type Chronic pain   ? Pain Onset More than a month ago   ? Pain Frequency Occasional   ? ?  ?  ? ?  ? ? ? ? ?R wrist AROM WNL - strength in all planes 5/5 - 1/10 pain over ulnar wrist coming in but not constant tender 1/10 over FCR and TFCC. ?Patient reports since last week had pain on ulnar styloid with at the end of the day about 3 days out of the 7 and on the radial wrist 1 time out of the week. ? ? ? ?  Reviewed with patient again that weightbearing, a grip with a twist and a composite tight grip  will probably bother her ulnar wrist because of arthritic changes.  Patient thinks during her pelvic health home exercises she pushes with her right palm, patient to try and modify pushing through elbow and see if that will decrease discomfort there.  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 ?Continue with pain-free active range of motion and active assist range of motion for wrist in all planes if needed do it after heat or isometric.  Patient to continue with modifications and joint protection to decrease pain at right wrist because of arthritic changes at TFCC and cyst at radial wrist. ? ? ? ? ? ? ? OT Treatments/Exercises (OP) - 04/21/22 0001   ? ?  ? Iontophoresis  ? Type of Iontophoresis Dexamethasone   ? Location TFCC right  wrist   ? Dose med patch, 2.0 current   ? Time 19   ? ?  ?  ? ?  ? ? ? ? ? ? ? ? ? OT Education - 04/21/22 1645   ? ? Education Details Progress, home program modifications.   ? Person(s) Educated Patient   ? Methods Explanation;Demonstration;Tactile cues;Verbal cues;Handout   ? Comprehension Verbal cues required;Returned demonstration;Verbalized understanding   ? ?  ?  ? ?  ? ? ? ? ? ? OT Long Term Goals - 04/13/22 1013   ? ?  ? OT LONG TERM GOAL #1  ? Title Pt to be independent in HEP to increase  R wrist AROM to WNL to wear splint 50% of time and pain less than 2/10   ? Baseline wrist splint more than 85% of time - pain 2-8/10 and decrease wrist AROM in all planes   Wearing splin t25% of time , increase AROM WNL pain  1-3/10 with AROM   ? Time 4   ? Period Weeks   ? Status On-going   ? Target Date 05/11/22   ?  ? OT LONG TERM GOAL #2  ? Title Pt to verbalize and demo 3 joint protection or modifications /AE to use to decrease pain in R wrist and ease of doing tasks in ADL's and IADL's   ? Status Achieved   ?  ? OT LONG TERM GOAL #3  ? Title Pt show show increase grip and prenhension by 1-3 lbs without pain to wear splint less than 50% of time or more wrist wrap   ? Baseline 85% or more in wrist splint -pain 2-8/10 with use - grip R 44,L 45 ,  3 point 11 lbs , lat grip R 11 , L 10 lbs  NOW  no pain with grip or prehension - and and splint less than 25 % of the time   ? Status Achieved   ? ?  ?  ? ?  ? ? ? ? ? ? ? ? Plan - 04/21/22 1646   ? ? Clinical Impression Statement Pt report having pain in R wrist for a while and wearing wrist splint since Nov 2022 - She had her MRI on 01/27/2022. 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 She had on 02/01/22 diagnostic/therapeutic injection to help determine  her primary pain generator. Pt also referred to OT to decrease pain and increase AROM and strength for functional use-  NOW since evaluation patient show active range of motion  at right wrist within normal limits and report only having 1 time pain this past week on the radial wrist and 3 times at ulnar styloid.  No pain coming in today. Grip and prehension strength WNL and no pain.  Patient reports wearing a wrist immobilization splint a portion of the day when she is engaged in heavier work tasks that is about 25% of the time.  Patient report pain coming in today only about a 1/10 at rest.  Pt to cont with joint protection and modifications -will cont 1 x wk for 1 wk and then biweekly for 2 visits to monitor if she can maintain progress. Pt to keep pain under 2/10 -patient continue to benefit from OT skilled services to decrease pain increase range of motion and strength without increased symptoms.   ? OT Occupational Profile and History Problem Focused Assessment - Including review of records relating to presenting problem   ? Occupational performance deficits (Please refer to evaluation for details): ADL's;IADL's;Work;Play;Leisure;Social Participation   ? Body Structure / Function / Physical Skills ADL;Decreased knowledge of use of DME;Strength;Pain;UE functional use;ROM;IADL;Flexibility   ? Rehab Potential Good   ? Clinical Decision Making Limited treatment options, no task modification necessary   ? Comorbidities Affecting Occupational Performance: May have comorbidities impacting occupational performance   ? Modification or Assistance to Complete Evaluation  No modification of tasks or assist necessary to complete eval   ? OT Frequency 1x / week   ? OT Duration 4 weeks   ? OT Treatment/Interventions Self-care/ADL training;Paraffin;Fluidtherapy;Contrast Bath;Therapeutic exercise;Patient/family education;Manual Therapy;Passive range of motion;Splinting;DME and/or AE instruction;Iontophoresis   ? Consulted and  Agree with Plan of Care Patient   ? ?  ?  ? ?  ? ? ?Patient will benefit from skilled therapeutic intervention in order to improve the following deficits and impairments:   ?Body Structure / Function / Physical

## 2022-04-29 ENCOUNTER — Ambulatory Visit: Payer: BC Managed Care – PPO | Admitting: Occupational Therapy

## 2022-04-29 DIAGNOSIS — M25531 Pain in right wrist: Secondary | ICD-10-CM

## 2022-04-29 DIAGNOSIS — M25631 Stiffness of right wrist, not elsewhere classified: Secondary | ICD-10-CM

## 2022-04-29 DIAGNOSIS — G8929 Other chronic pain: Secondary | ICD-10-CM

## 2022-04-29 DIAGNOSIS — M6281 Muscle weakness (generalized): Secondary | ICD-10-CM | POA: Diagnosis not present

## 2022-04-29 NOTE — Therapy (Signed)
[place] ?[facility] ?[address] ?[place], [place], [address] ?Phone: [phone]   Fax:  [phone] ? ?Occupational Therapy Treatment ? ?Patient Details  ?Name: Debbie Newman ?MRN: [id number] ?Date of Birth: 1961/05/28 ?Referring Provider (OT): Mikle Bosworth ? ? ?Encounter Date: 04/29/2022 ? ? OT End of Session - 04/29/22 0945   ? ? Visit Number 14   ? Number of Visits 16   ? Date for OT Re-Evaluation 05/25/22   ? OT Start Time 0945   ? OT Stop Time 1001   ? OT Time Calculation (min) 16 min   ? Activity Tolerance Patient tolerated treatment well   ? Behavior During Therapy [facility] for tasks assessed/performed   ? ?  ?  ? ?  ? ? ?Past Medical History:  ?Diagnosis Date  ? Female bladder prolapse 2013  ? Hiatal hernia 2013  ? ? ?Past Surgical History:  ?Procedure Laterality Date  ? [place]  ? CHOLECYSTECTOMY N/A 07/03/2015  ? Procedure: LAPAROSCOPIC CHOLECYSTECTOMY WITH INTRAOPERATIVE CHOLANGIOGRAM;  Surgeon: Leonie Green, MD;  Location: [facility] ORS;  Service: General;  Laterality: N/A;  ? TONSILLECTOMY    ? ? ?There were no vitals filed for this visit. ? ? Subjective Assessment - 04/29/22 0945   ? ? Subjective  I was doing okay I am wearing my brace about 75% when I am working.  Did not hurt really pain except when I Saturday was out and about and came back after the day not wearing my brace and I had pain on this bone at my wrist.   ? 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 They were last evaluated by Cameron Proud, PA on 01/15/2022. At that time, the plan was to perform a right wrist MRI arthrogram, use a short arm wrist brace, then follow-up after MRI. She had her MRI on 01/27/2022. 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 She was recommended follow-up with reevaluation and consideration of diagnostic/therapeutic injection to help determine her primary pain generator   ? Patient Stated Goals Would like my pain in my wrist to be better to be able to use it more without wearing splint all the time   ? Currently in Pain? No/denies   ? ?  ?  ? ?  ? ? ? ? ? [place] OT Assessment - 04/29/22 0001   ? ?  ? Strength  ? Right Hand Grip (lbs) 46   ? Right Hand Lateral Pinch 11 lbs   ? Right Hand 3 Point Pinch 15 lbs   ? Left Hand Grip (lbs) 42   ? Left Hand Lateral Pinch 11 lbs   ? Left Hand 3 Point Pinch 12 lbs   ? ?  ?  ? ?  ? ? ? ?R wrist AROM WNL - strength in all planes 5/5 -no pain this date and resistance to wrist and end range as well as active range of motion.   ?No tenderness over TFCC or ulnar styloid.  Did had tenderness over volar radial wrist where cyst is. ?Patient report not having any pain with use and wearing her wrist brace most of the time when  she is at work. ?The only pain patient had was Saturday when she was out and about at the stores not wearing her brace for about 8 hours and had pain at the ulnar styloid.  ?  ?  ?  Reviewed with patient again that weightbearing, a grip with a twist and a composite tight grip will probably bother her ulnar wrist because of arthritic changes.  Patient thinks during her pelvic health home exercises she pushes with her right palm, patient to try and modify pushing through elbow and see if that will decrease discomfort there.  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 ?Continue with pain-free active range of motion and active assist range of motion for wrist in all planes if needed do it after heat or isometric.  Patient to continue with modifications and joint protection to decrease pain  at right wrist because of arthritic changes at TFCC and cyst at radial wrist. ? ? ? ? ? ? ? ? ? ? ? ? ? ? ? OT Education - 04/29/22 0945   ? ? Education Details Progress, home program modifications.   ? Person(s) Educated Patient   ? Methods Explanation;Demonstration;Tactile cues;Verbal cues;Handout   ? Comprehension Verbal cues required;Returned demonstration;Verbalized understanding   ? ?  ?  ? ?  ? ? ? ? ? ? OT Long Term Goals - 04/29/22 1014   ? ?  ? OT LONG TERM GOAL #1  ? Title Pt to be independent in HEP to increase  R wrist AROM to WNL to wear splint 50% of time and pain less than 2/10   ? Status Achieved   ?  ? OT LONG TERM GOAL #2  ? Title Pt to verbalize and demo 3 joint protection or modifications /AE to use to decrease pain in R wrist and ease of doing tasks in ADL's and IADL's   ? Status Achieved   ?  ? OT LONG TERM GOAL #3  ? Title Pt show show increase grip and prenhension by 1-3 lbs without pain to wear splint less than 50% of time or more wrist wrap   ? Status Achieved   ? ?  ?  ? ?  ? ? ? ? ? ? ? ? Plan - 04/29/22 0945   ? ? Clinical Impression Statement Pt report having pain in R wrist for a while and wearing wrist splint since Nov 2022 - She had her MRI on 01/27/2022. 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 She had on 02/01/22 diagnostic/therapeutic injection to help determine her primary pain generator. Pt also referred to OT to decrease pain and increase AROM and strength for functional use-patient made great progress from start of care in pain at right wrist, active range of motion for wrist within normal limits now and pain-free.  Strength in the wrist in all planes 5/5 with no pain.  Patient tender over volar radial wrist where cystis.  At times she do have pain at the  ulnar styloid but no tenderness today, could be arthritic changes that was shown on MRI.  Patient to continue to wear brace as needed continue to modify her activities to stay pain-free and maintain her active range of motion and strength in bilateral hands that is within normal limits.  Patient will continue with home  program for 2 weeks with a possible discharge.   ? OT Occupational Profile and History Problem Focused Assessment - Including review of records relating to presenting problem   ? Occupational performance deficits (Please refer to evaluation for details): ADL's;IADL's;Work;Play;Leisure;Social Participation   ? Body Structure / Function / Physical Skills ADL;Decreased knowledge of use of DME;Strength;Pain;UE functional use;ROM;IADL;Flexibility   ? Rehab Potential Good   ? Clinical Decision Making Limited treatment options, no task modification necessary   ? Comorbidities Affecting Occupational Performance: May have comorbidities impacting occupational performance   ? Modification or Assistance to Complete Evaluation  No modification of tasks or assist necessary to complete eval   ? OT Frequency Biweekly   ? OT Duration 4 weeks   ? OT Treatment/Interventions Self-care/ADL training;Paraffin;Fluidtherapy;Contrast Bath;Therapeutic exercise;Patient/family education;Manual Therapy;Passive range of motion;Splinting;DME and/or AE instruction;Iontophoresis   ? Consulted and Agree with Plan of Care Patient   ? ?  ?  ? ?  ? ? ?Patient will benefit from skilled therapeutic intervention in order to improve the following deficits and impairments:   ?Body Structure / Function / Physical Skills: ADL, Decreased knowledge of use of DME, Strength, Pain, UE functional use, ROM, IADL, Flexibility ?  ?  ? ? ?Visit Diagnosis: ?Muscle weakness (generalized) ? ?Pain in right wrist ? ?Stiffness of right wrist, not elsewhere classified ? ?Chronic pain of right knee ? ? ? ?Problem List ?There are no problems to display for this  patient. ? ? ?Rosalyn Gess, OTR/L,CLT ?04/29/2022, 10:16 AM ? ?[place] ?[facility] ?[address] ?[place], [place], [address] ?Phone: [phone]   F

## 2022-04-30 ENCOUNTER — Ambulatory Visit: Payer: BC Managed Care – PPO | Admitting: Physical Therapy

## 2022-04-30 DIAGNOSIS — M6281 Muscle weakness (generalized): Secondary | ICD-10-CM | POA: Diagnosis not present

## 2022-04-30 DIAGNOSIS — M25631 Stiffness of right wrist, not elsewhere classified: Secondary | ICD-10-CM

## 2022-04-30 DIAGNOSIS — R278 Other lack of coordination: Secondary | ICD-10-CM

## 2022-04-30 DIAGNOSIS — R2689 Other abnormalities of gait and mobility: Secondary | ICD-10-CM

## 2022-04-30 DIAGNOSIS — G8929 Other chronic pain: Secondary | ICD-10-CM

## 2022-04-30 NOTE — Therapy (Signed)
[place] ?[facility] ?[place], [place], [address] ?Phone: [phone]   Fax:  [phone] ? ?Physical Therapy Treatment ? ?Patient Details  ?Name: Debbie Newman ?MRN: [id number] ?Date of Birth: 06-21-1961 ?Referring Provider (PT): Leafy Ro ? ? ?Encounter Date: 04/30/2022 ? ? PT End of Session - 04/30/22 0807   ? ? Visit Number 6   ? Date for PT Re-Evaluation 05/13/22   ? PT Start Time 0800   ? PT Stop Time 0900   ? PT Time Calculation (min) 60 min   ? Activity Tolerance Patient tolerated treatment well   ? Behavior During Therapy [facility] for tasks assessed/performed   ? ?  ?  ? ?  ? ? ?Past Medical History:  ?Diagnosis Date  ? Female bladder prolapse 2013  ? Hiatal hernia 2013  ? ? ?Past Surgical History:  ?Procedure Laterality Date  ? [place]  ? CHOLECYSTECTOMY N/A 07/03/2015  ? Procedure: LAPAROSCOPIC CHOLECYSTECTOMY WITH INTRAOPERATIVE CHOLANGIOGRAM;  Surgeon: Leonie Green, MD;  Location: [facility] ORS;  Service: General;  Laterality: N/A;  ? TONSILLECTOMY    ? ? ?There were no vitals filed for this visit. ? ? Subjective Assessment - 04/30/22 0806   ? ? Subjective Pt had no leakage across the past 2 weeks. Knee and LBP  pain is gone.   ? Pertinent History C section x 1, Vaginal deliveries x 2 , gall bladder removal.  Fitness : rides a stationery bike 15 min, stretching routine before riding,   ? ?  ?  ? ?  ? ? ? ? ? ? ? ? ? ? ? ? ? ? ? ? ? Pelvic Floor Special Questions - 04/30/22 0900   ? ? Prolapse Anterior Wall   standing: inside introitus, slightly above border,  ? Pelvic Floor Internal Exam pt consented verbally and had no contraindications   ? Exam Type Vaginal   ? Palpation tightness and tenderness 11 o'clock R   ? Strength fair squeeze, definite lift   required cues for less oblique overuse, assessed without pillow under hips  ? ?  ?  ? ?  ? ? ? ? [place] [facility] PT Treatment/Exercise - 04/30/22 0901   ? ?  ? Neuro Re-ed   ? Neuro Re-ed  Details  cued for less overuse of oblique to minimnize downward movement of pelvic floor ( dyscoordination)   ?  ? Modalities  ? Modalities Moist Heat   ?  ? Moist Heat Therapy  ? Number Minutes Moist Heat 5 Minutes   ? Moist Heat Location Other (comment)   perineum with review of deep core level 1-2  ?  ? Manual Therapy  ? Manual Therapy Internal Pelvic Floor   ? Manual therapy comments STM/MWM at R pelvic floor to promote decreased tensions   ? ?  ?  ? ?  ? ? ? ? ? ? ? ? ? ? ? ? ? ? ? PT Long Term Goals - 04/30/22 0808   ? ?  ? PT LONG TERM GOAL #1  ? Title Pt will demo proper coordination of deep core coordination promote improved position of bladder and to urinate with less difficulty   ? Time 4   ? Period Weeks   ? Status Achieved   ? Target Date 04/01/22   ?  ? PT LONG TERM GOAL #2  ? Title Pt will demo levelled pelvic and shoulder alignment ( R side lowered) and improved  recpirocal gait pattern in order to advance to deep core exericses and decrease CLBP   ? Time 2   ? Period Weeks   ? Status Achieved   ? Target Date 03/18/22   ?  ? PT LONG TERM GOAL #3  ? Title Pt will improve FOTO score for LBP score will have  5 pt change  ( 59pt ) in order to demo improved functional mobility   ? Time 10   ? Period Weeks   ? Status On-going   ? Target Date 05/13/22   ?  ? PT LONG TERM GOAL #4  ? Title Pt will report completing urination 100% of the time in order to minimize UTI and promote hygience   ? Time 8   ? Period Weeks   ? Status Achieved   ? Target Date 04/29/22   ?  ? PT LONG TERM GOAL #5  ? Title Pt will increase  hip abduction 3/5 B to > 4/5 in order to minimize LBP and improve pelvic dysfunctions  ( 04/30/22: 5/5 B)   ? Time 8   ? Period Weeks   ? Status On-going   ? Target Date 05/26/22   ? ?  ?  ? ?  ? ? ? ? ? ? ? ? Plan - 04/30/22 0809   ? ? Clinical Impression Statement Pt 's LBP and knee pain have resolved. ? ?Pt had no leakage the past 2 weeks.  ? ?Pt demo'd increased tensions at R pelvic floor and  required cues for less oblique mm overuse to correct coordination of pelvic floor. Pt demo'd form correctly to elicit stronger pelvic floor contraction. Witholding sustained contractions until pt demo coordination without need of cues.  Advised to continue use of pillow under hips/ buttocks for gravity eliminated position. Pt continues to beenfit from skilled PT.   ? Examination-Activity Limitations Toileting;Locomotion Level   ? Stability/Clinical Decision Making Evolving/Moderate complexity   ? Rehab Potential Good   ? PT Frequency 1x / week   ? PT Duration Other (comment)   10  ? PT Treatment/Interventions Gait training;Stair training;Functional mobility training;Therapeutic activities;Patient/family education;Therapeutic exercise;Manual techniques;Neuromuscular re-education;Balance training;Scar mobilization;Taping;Cryotherapy;ADLs/Self Care Home Management;Moist Heat;Dry needling   ? Consulted and Agree with Plan of Care Patient   ? ?  ?  ? ?  ? ? ?Patient will benefit from skilled therapeutic intervention in order to improve the following deficits and impairments:  Decreased activity tolerance, Decreased coordination, Decreased endurance, Decreased strength, Decreased range of motion, Impaired flexibility, Hypomobility, Decreased scar mobility, Decreased mobility, Abnormal gait, Increased muscle spasms, Increased fascial restricitons, Improper body mechanics, Pain, Postural dysfunction, Decreased knowledge of use of DME, Decreased balance ? ?Visit Diagnosis: ?Muscle weakness (generalized) ? ?Stiffness of right wrist, not elsewhere classified ? ?Chronic pain of right knee ? ?Other lack of coordination ? ?Chronic bilateral low back pain with left-sided sciatica ? ?Other abnormalities of gait and mobility ? ? ? ? ?Problem List ?There are no problems to display for this patient. ? ? ?Jerl Mina, PT ?04/30/2022, 9:04 AM ? ?[place] ?[facility] ?[place], [place], [address] ?Phone: [phone]   Fax:  [phone] ? ?Name: Debbie Newman ?MRN: [id number] ?Date of Birth: 1961/06/04 ? ? ? ?

## 2022-04-30 NOTE — Patient Instructions (Addendum)
Stretch for pelvic floor  ? ?V- slides  ?"v heels slide away and then back toward buttocks and then rock knee to slight ,  ?slide heel along at 11 o clock away from buttocks  ? ?10 reps  ? ? ?__ ? ? Practice proper pelvic floor coordination ? ?Inhale thru nose: expand ribs 360 deg ,  pelvic floor muscles relax  ?Exhale thru nose " "j" scoop, allow pelvic floor to close, lift first before belly sinks  ? ?( not "draw abdominal muscle to spine" or strain with abdominal muscles")   ?

## 2022-05-07 ENCOUNTER — Encounter: Payer: BC Managed Care – PPO | Admitting: Physical Therapy

## 2022-05-11 ENCOUNTER — Encounter: Payer: BC Managed Care – PPO | Admitting: Physical Therapy

## 2022-05-13 ENCOUNTER — Ambulatory Visit: Payer: BC Managed Care – PPO | Admitting: Occupational Therapy

## 2022-05-13 DIAGNOSIS — M6281 Muscle weakness (generalized): Secondary | ICD-10-CM | POA: Diagnosis not present

## 2022-05-13 DIAGNOSIS — M25531 Pain in right wrist: Secondary | ICD-10-CM

## 2022-05-13 DIAGNOSIS — M25631 Stiffness of right wrist, not elsewhere classified: Secondary | ICD-10-CM

## 2022-05-13 NOTE — Therapy (Signed)
[facility] [address], [place], [address] Phone: [phone]   Fax:  [phone]  Occupational Therapy Treatment  Patient Details  Name: Debbie Newman MRN: [id number] Date of Birth: 1961/04/29 Referring Provider (OT): Mikle Bosworth   Encounter Date: 05/13/2022   OT End of Session - 05/13/22 1008     Visit Number 15    Number of Visits 16    Date for OT Re-Evaluation 05/25/22    OT Start Time 0941    OT Stop Time 1005    OT Time Calculation (min) 24 min    Activity Tolerance Patient tolerated treatment well    Behavior During Therapy [facility] for tasks assessed/performed             Past Medical History:  Diagnosis Date   Female bladder prolapse 2013   Hiatal hernia 2013    Past Surgical History:  Procedure Laterality Date   CESAREAN SECTION  1999   CHOLECYSTECTOMY N/A 07/03/2015   Procedure: LAPAROSCOPIC CHOLECYSTECTOMY WITH INTRAOPERATIVE CHOLANGIOGRAM;  Surgeon: Leonie Green, MD;  Location: [facility] ORS;  Service: General;  Laterality: N/A;   TONSILLECTOMY      There were no vitals filed for this visit.   Subjective Assessment - 05/13/22 1007     Subjective  My wrist has been doing great I am only wearing my brace about 5% just with activities with ability to lift things or anything heavy.  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 They were last evaluated by Cameron Proud, PA on 01/15/2022. At that time, the plan was to perform a right wrist MRI arthrogram, use a short arm wrist brace, then follow-up after MRI. She had her MRI on 01/27/2022. 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 She was recommended follow-up with reevaluation and consideration of diagnostic/therapeutic injection to help determine her primary pain generator    Patient Stated Goals Would like my pain in my wrist to be better to be able to use it more without wearing splint all the time    Currently in Pain? No/denies                [facility] OT Assessment - 05/13/22 0001       AROM   Right Wrist Extension 70 Degrees    Right Wrist Flexion 90 Degrees    Right Wrist Radial Deviation 25 Degrees    Right Wrist Ulnar Deviation 30 Degrees      Strength   Right Hand Grip (lbs) 46    Right Hand Lateral Pinch 11 lbs    Right Hand 3 Point Pinch 15 lbs    Left Hand Grip (lbs) 45    Left Hand Lateral Pinch 12 lbs    Left Hand 3 Point Pinch 15 lbs                  R wrist AROM WNL - strength in all planes 5/5 -no pain this date and resistance to wrist at end range as well as active range of motion.   No tenderness over TFCC or ulnar styloid.  Did had tenderness over volar  radial wrist where cyst is. Patient report not having any pain with use and wearing her wrist brace most of the time when she is at work. Pt report had no pain really this last 2 wks She modify activities , or use L hand - or husband assist- she know now what type of motions or activities cause issues.      Reviewed with patient again that weightbearing, a grip with a twist and a composite tight grip will probably bother her ulnar wrist because of arthritic changes.  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 Continue with pain-free active range of motion and active assist range of motion for wrist in all planes if needed do it after heat.  Patient to continue with modifications and joint protection to decrease pain at right wrist because of arthritic changes at [facility] and cyst at radial  wrist.  Pt to call me otherwise will discharge her                   OT Education - 05/13/22 1008     Education Details Progress, home program modifications.    Person(s) Educated Patient    Methods Explanation;Demonstration;Tactile cues;Verbal cues;Handout    Comprehension Verbal cues required;Returned demonstration;Verbalized understanding                 OT Long Term Goals - 04/29/22 1014       OT LONG TERM GOAL #1   Title Pt to be independent in HEP to increase  R wrist AROM to WNL to wear splint 50% of time and pain less than 2/10    Status Achieved      OT LONG TERM GOAL #2   Title Pt to verbalize and demo 3 joint protection or modifications /AE to use to decrease pain in R wrist and ease of doing tasks in ADL's and IADL's    Status Achieved      OT LONG TERM GOAL #3   Title Pt show show increase grip and prenhension by 1-3 lbs without pain to wear splint less than 50% of time or more wrist wrap    Status Achieved                   Plan - 05/13/22 1028     Clinical Impression Statement Pt report having pain in R wrist for a while and wearing wrist splint since Nov 2022 - She had her MRI on 01/27/2022. 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 She had on 02/01/22 diagnostic/therapeutic injection to help determine her primary pain generator. Pt also referred to OT to decrease pain and increase AROM and strength for functional [place]. Pt made great progress from start of care in pain at right wrist, active range of motion for wrist within normal limits now and pain-free .  Strength in the wrist in all planes 5/5 with no pain.  Patient tender over volar radial wrist where cyst is.  At times she do have pain at the ulnar styloid but no  tenderness today, could be arthritic changes that was shown on MRI.  Patient to continue to wear brace as needed with heavy activities as well as modifying activities that cause pain. SHe report only wearing wrist pslint now only for heavy activities- about 5 % of  the time. Pt met all goals and independent in homeprogram - pt to contact me if needed  otherwise will discharge her    OT Occupational Profile and History Problem Focused Assessment - Including review of records relating to presenting problem    Occupational performance deficits (Please refer to evaluation for details): ADL's;IADL's;Work;Play;Leisure;Social Participation    Body Structure / Function / Physical Skills ADL;Decreased knowledge of use of DME;Strength;Pain;UE functional use;ROM;IADL;Flexibility    Rehab Potential Good    Clinical Decision Making Limited treatment options, no task modification necessary    Comorbidities Affecting Occupational Performance: May have comorbidities impacting occupational performance    Modification or Assistance to Complete Evaluation  No modification of tasks or assist necessary to complete eval    OT Treatment/Interventions Self-care/ADL training;Paraffin;Fluidtherapy;Contrast Bath;Therapeutic exercise;Patient/family education;Manual Therapy;Passive range of motion;Splinting;DME and/or AE instruction;Iontophoresis    Consulted and Agree with Plan of Care Patient             Patient will benefit from skilled therapeutic intervention in order to improve the following deficits and impairments:   Body Structure / Function / Physical Skills: ADL, Decreased knowledge of use of DME, Strength, Pain, UE functional use, ROM, IADL, Flexibility       Visit Diagnosis: Muscle weakness (generalized)  Stiffness of right wrist, not elsewhere classified  Pain in right wrist    Problem List There are no problems to display for this patient.   Rosalyn Gess, OTR/L,CLT 05/13/2022, 10:32  AM  [facility] [address] [place], [address] Phone: [phone]   Fax:  [phone]  Name: Debbie Newman MRN: [id number] Date of Birth: [birth year]

## 2022-05-20 ENCOUNTER — Ambulatory Visit: Payer: BC Managed Care – PPO | Attending: Obstetrics and Gynecology | Admitting: Physical Therapy

## 2022-05-20 ENCOUNTER — Encounter: Payer: BC Managed Care – PPO | Admitting: Physical Therapy

## 2022-05-20 DIAGNOSIS — M25531 Pain in right wrist: Secondary | ICD-10-CM | POA: Diagnosis not present

## 2022-05-20 DIAGNOSIS — M6281 Muscle weakness (generalized): Secondary | ICD-10-CM | POA: Insufficient documentation

## 2022-05-20 DIAGNOSIS — M5442 Lumbago with sciatica, left side: Secondary | ICD-10-CM | POA: Diagnosis present

## 2022-05-20 DIAGNOSIS — G8929 Other chronic pain: Secondary | ICD-10-CM | POA: Diagnosis present

## 2022-05-20 DIAGNOSIS — M25561 Pain in right knee: Secondary | ICD-10-CM | POA: Diagnosis present

## 2022-05-20 DIAGNOSIS — R278 Other lack of coordination: Secondary | ICD-10-CM | POA: Diagnosis present

## 2022-05-20 DIAGNOSIS — M25631 Stiffness of right wrist, not elsewhere classified: Secondary | ICD-10-CM | POA: Insufficient documentation

## 2022-05-20 NOTE — Patient Instructions (Addendum)
Practice proper pelvic floor coordination  Inhale: expand pelvic floor muscles Exhale" "j" scoop, allow pelvic floor to close, lift first before belly sinks   ( not "draw abdominal muscle to spine" or strain with abdominal muscles")     ___  PELVIC FLOOR / KEGEL EXERCISES   Pelvic floor/ Kegel exercises are used to strengthen the muscles in the base of your pelvis that are responsible for supporting your pelvic organs and preventing urine/feces leakage. Based on your therapist's recommendations, they can be performed while standing, sitting, or lying down. Imagine pelvic floor area as a diamond with pelvic landmarks: top =pubic bone, bottom tip=tailbone, sides=sitting bones (ischial tuberosities).    Make yourself aware of this muscle group by using these cues while coordinating your breath: Inhale, feel pelvic floor diamond area lower like hammock towards your feet and ribcage/belly expanding. Pause. Let the exhale naturally and feel your belly sink, abdominal muscles hugging in around you and you may notice the pelvic diamond draws upward towards your head forming a umbrella shape. Give a squeeze during the exhalation like you are stopping the flow of urine. If you are squeezing the buttock muscles, try to give 50% less effort.   Common Errors: Breath holding: If you are holding your breath, you may be bearing down against your bladder instead of pulling it up. If you belly bulges up while you are squeezing, you are holding your breath. Be sure to breathe gently in and out while exercising. Counting out loud may help you avoid holding your breath. Accessory muscle use: You should not see or feel other muscle movement when performing pelvic floor exercises. When done properly, no one can tell that you are performing the exercises. Keep the buttocks, belly and inner thighs relaxed. Overdoing it: Your muscles can fatigue and stop working for you if you over-exercise. You may actually leak more  or feel soreness at the lower abdomen or rectum.  YOUR HOME EXERCISE PROGRAM  LONG HOLDS:   Position: on back or reclined in car seat ( do not lift head to sit up, instead make sure to use the handle to raise the car seat up and keep spine/head relaxed to not place load on pelvic floor/ abdominal muscle)    Inhale and then exhale. Then squeeze the muscle and count aloud for 3 seconds. Rest with three long breaths. (Be sure to let belly sink in with exhales and not push outward) Perform 4 repetitions, 4 different times/day  ( before and after deep core level 1 and 2 in the morning and night)   SHORT HOLDS: Position: on sitting  Inhale and then exhale. Then squeeze the muscle.  (Be sure to let belly sink in with exhales and not push outward) Perform 4 repetitions, 3 different  Times/day                      DECREASE DOWNWARD PRESSURE ON  YOUR PELVIC FLOOR, ABDOMINAL, LOW BACK MUSCLES       PRESERVE YOUR PELVIC HEALTH LONG-TERM   ** SQUEEZE pelvic floor BEFORE YOUR SNEEZE, COUGH, LAUGH   ** EXHALE BEFORE YOU RISE AGAINST GRAVITY (lifting, sit to stand, from squat to stand)   ** LOG ROLL OUT OF BED INSTEAD OF CRUNCH/SIT-UP

## 2022-05-20 NOTE — Addendum Note (Signed)
Addended by: Mariane Masters on: 05/20/2022 11:59 AM   Modules accepted: Orders

## 2022-05-20 NOTE — Therapy (Addendum)
[place] MAIN [facility] SERVICES [address] [place], [place], [address] Phone: [phone]   Fax:  [phone]  Physical Therapy Treatment /Recert   Patient Details  Name: Debbie Newman MRN: [id number] Date of Birth: [birth year] Referring Provider (PT): Leafy Ro   Encounter Date: 05/20/2022   PT End of Session - 05/20/22 0907     Visit Number 7    Date for PT Re-Evaluation 07/29/22    PT Start Time 0902    PT Stop Time 1000    PT Time Calculation (min) 58 min    Activity Tolerance Patient tolerated treatment well    Behavior During Therapy [facility] for tasks assessed/performed             Past Medical History:  Diagnosis Date   Female bladder prolapse 2013   Hiatal hernia 2013    Past Surgical History:  Procedure Laterality Date   CESAREAN SECTION  1999   CHOLECYSTECTOMY N/A 07/03/2015   Procedure: LAPAROSCOPIC CHOLECYSTECTOMY WITH INTRAOPERATIVE CHOLANGIOGRAM;  Surgeon: Leonie Green, MD;  Location: [facility] ORS;  Service: General;  Laterality: N/A;   TONSILLECTOMY      There were no vitals filed for this visit.   Subjective Assessment - 05/20/22 0905     Subjective Pt had no leakage across  the past month. Pt walked up a hill on vacation and she had no knee/ LBP pain. Someimtes prolapse feels lowere at the end of the day    Pertinent History C section x 1, Vaginal deliveries x 2 , gall bladder removal.  Fitness : rides a stationery bike 15 min, stretching routine before riding,                            Pelvic Floor Special Questions - 05/20/22 0953     Pelvic Floor Internal Exam pt consented verbally and had no contraindications    Exam Type Vaginal    Palpation tightness at ischiocavernosus/ bulbospongioug at the pubic symphysis B    Strength good squeeze, good lift, able to hold agaisnt strong resistance   cued for sequential coordination and less ab overuse   Strength # of reps 4    Strength # of  seconds 3                             PT Long Term Goals - 05/20/22 0908       PT LONG TERM GOAL #1   Title Pt will demo proper coordination of deep core coordination promote improved position of bladder and to urinate with less difficulty    Time 4    Period Weeks    Status Achieved    Target Date 04/01/22      PT LONG TERM GOAL #2   Title Pt will demo levelled pelvic and shoulder alignment ( R side lowered) and improved recpirocal gait pattern in order to advance to deep core exericses and decrease CLBP    Time 2    Period Weeks    Status Achieved    Target Date 03/18/22      PT LONG TERM GOAL #3   Title Pt will improve FOTO score for LBP score will have  5 pt change  ( 59pt ) in order to demo improved functional mobility  ( 05/20/22: 83 pts)    Time 10    Period Weeks    Status Achieved  Target Date 05/13/22      PT LONG TERM GOAL #4   Title Pt will report completing urination 100% of the time in order to minimize UTI and promote hygience    Time 8    Period Weeks    Status Achieved    Target Date 04/29/22      PT LONG TERM GOAL #5   Title Pt will increase  hip abduction 3/5 B to > 4/5 in order to minimize LBP and improve pelvic dysfunctions  ( 04/30/22: 5/5 B)    Time 8    Period Weeks    Status Achieved    Target Date 05/26/22      Additional Long Term Goals   Additional Long Term Goals Yes      PT LONG TERM GOAL #6   Title Pt will demo proper kegels for quick contractions for 5 reps seated and 3 sec holds, 5 reps  without cues in hooklying to promote upward position of prolapse    Baseline 3 sec holds, 4 reps hooklying,  seated 4 reps    Time 10    Period Weeks    Status New    Target Date 07/29/22                   Plan - 05/20/22 1024     Clinical Impression Statement Pt has achieved 5/6 goals and progressing to remaining goal which is focused pelvic floor strengthening to minimize worsening of prolapse. Pt's LBP and knee  pain have resolved. Pt walked uphill while on vacation without pain.   Today, pt required more manual Tx to minimize remaining pelvic floor mm tightness along anterior area after which pt demo'd improved pelvic floor contraction. Pt required cues for anterior tilt of pelvis and less abdominal overuse to elicit a more upward movement of pelvic floor. Advanced pt to endurance strengthening of pelvic floor mm in gravity-eliminated position and progressed pt to seated quick contractions. Pt required cues for seated pelvic floor HEP. Pt continues benefit from skilled PT to achieve remaining goal.    Examination-Activity Limitations Toileting;Locomotion Level    Stability/Clinical Decision Making Evolving/Moderate complexity    Rehab Potential Good    PT Frequency 1x / week    PT Duration Other (comment)   10   PT Treatment/Interventions Gait training;Stair training;Functional mobility training;Therapeutic activities;Patient/family education;Therapeutic exercise;Manual techniques;Neuromuscular re-education;Balance training;Scar mobilization;Taping;Cryotherapy;ADLs/Self Care Home Management;Moist Heat;Dry needling    Consulted and Agree with Plan of Care Patient             Patient will benefit from skilled therapeutic intervention in order to improve the following deficits and impairments:  Decreased activity tolerance, Decreased coordination, Decreased endurance, Decreased strength, Decreased range of motion, Impaired flexibility, Hypomobility, Decreased scar mobility, Decreased mobility, Abnormal gait, Increased muscle spasms, Increased fascial restricitons, Improper body mechanics, Pain, Postural dysfunction, Decreased knowledge of use of DME, Decreased balance  Visit Diagnosis: Muscle weakness (generalized)  Chronic pain of right knee  Other lack of coordination  Chronic bilateral low back pain with left-sided sciatica     Problem List There are no problems to display for this  patient.   Jerl Mina, PT 05/20/2022, 10:54 AM  [place] MAIN [facility] SERVICES [address] [place], [place], [address] Phone: [phone]   Fax:  [phone]  Name: Debbie Newman MRN: [id number] Date of Birth: 05/17/1961

## 2022-05-26 ENCOUNTER — Encounter: Payer: BC Managed Care – PPO | Admitting: Physical Therapy

## 2022-06-07 ENCOUNTER — Encounter: Payer: BC Managed Care – PPO | Admitting: Physical Therapy

## 2022-06-15 ENCOUNTER — Ambulatory Visit: Payer: BC Managed Care – PPO | Admitting: Physical Therapy

## 2022-06-15 DIAGNOSIS — R278 Other lack of coordination: Secondary | ICD-10-CM

## 2022-06-15 DIAGNOSIS — M6281 Muscle weakness (generalized): Secondary | ICD-10-CM | POA: Diagnosis not present

## 2022-06-15 DIAGNOSIS — G8929 Other chronic pain: Secondary | ICD-10-CM

## 2022-06-15 DIAGNOSIS — M25631 Stiffness of right wrist, not elsewhere classified: Secondary | ICD-10-CM

## 2022-06-29 ENCOUNTER — Encounter: Payer: BC Managed Care – PPO | Admitting: Physical Therapy

## 2022-07-13 ENCOUNTER — Encounter: Payer: BC Managed Care – PPO | Admitting: Physical Therapy

## 2022-07-13 IMAGING — MR MR WRIST*R* W/ CM
6 series · 40 of 40 positions shown · IV contrast (agent unspecified)
Comparison: None.

CONTRAST:  0.05mL GADAVIST GADOBUTROL 1 MMOL/ML IV SOLN

CLINICAL DATA: History of injury in college years ago. Chronic
right wrist pain mainly over the thumb area.rrr

EXAM:
MR OF THE RIGHT WRIST WITH CONTRAST
TECHNIQUE: Multiplanar, multisequence MR imaging of the right wrist was
performed following the administration of intravenous contrast.

[Series 9: T2 fat-sat · axial · right · 2.0mm · 0.39mm/px · z∈[+48,+109]mm · 6 of 29 slices shown (1 of 3)]
[im 1/29]
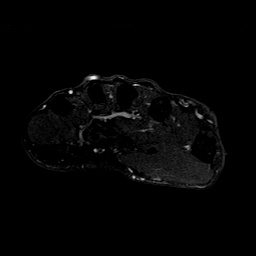
[im 6/29]
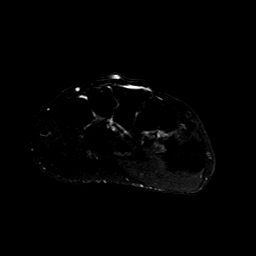
[im 12/29]
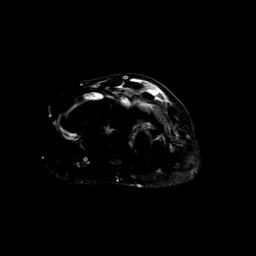
[im 17/29]
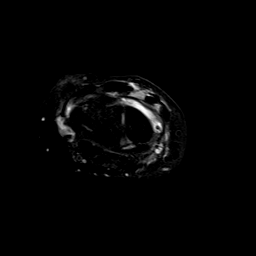
[im 23/29]
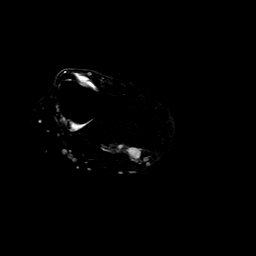
[im 29/29]
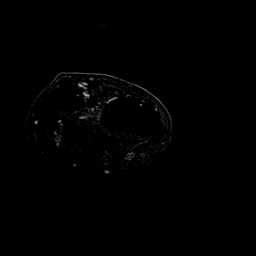

[Series 11: T1 fat-sat · coronal · right · 2.0mm · 0.31mm/px · 5 of 24 slices shown (1 of 2)]
[im 1/24]
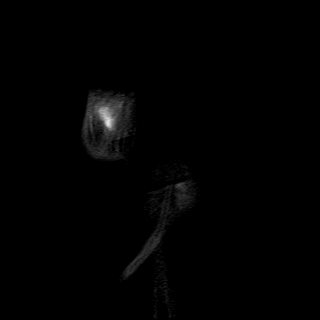
[im 6/24]
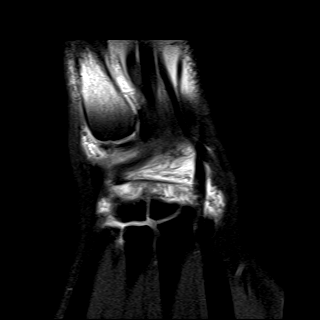
[im 12/24]
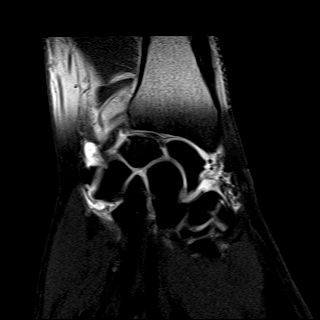
[im 18/24]
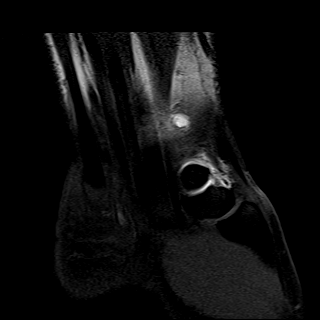
[im 24/24]
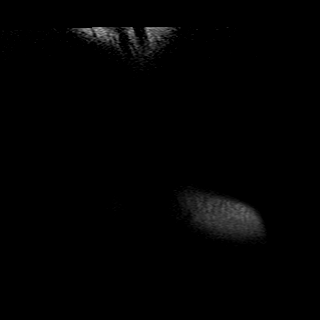

[Series 12: T2 fat-sat · coronal · right · 2.0mm · 0.39mm/px · 7 of 28 slices shown (2 of 3)]
[im 1/28]
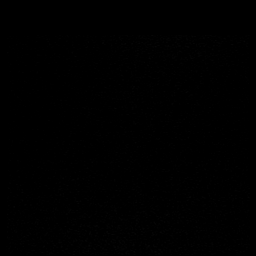
[im 5/28]
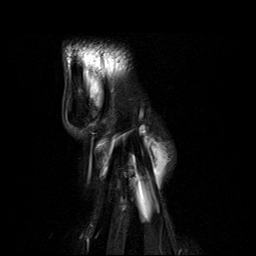
[im 10/28]
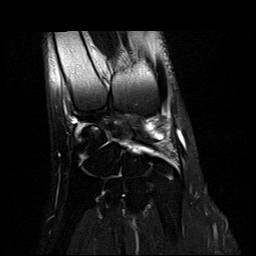
[im 14/28]
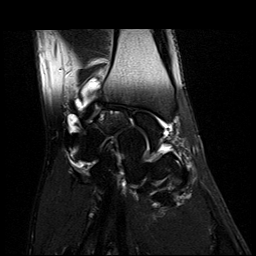
[im 19/28]
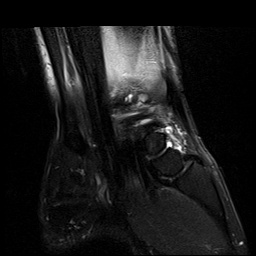
[im 23/28]
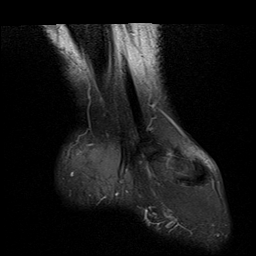
[im 28/28]
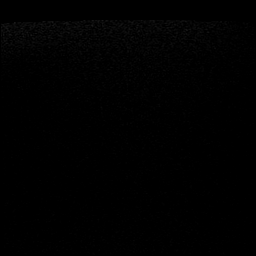

[Series 13: T1 · coronal · right · 2.0mm · 0.31mm/px · 7 of 28 slices shown]
[im 1/28]
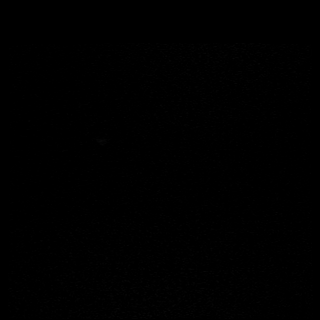
[im 5/28]
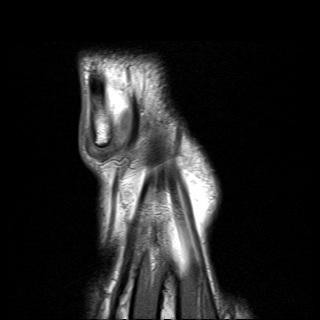
[im 10/28]
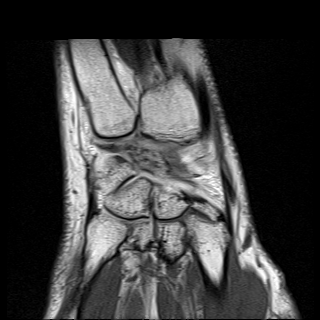
[im 14/28]
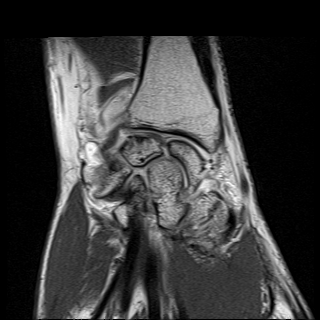
[im 19/28]
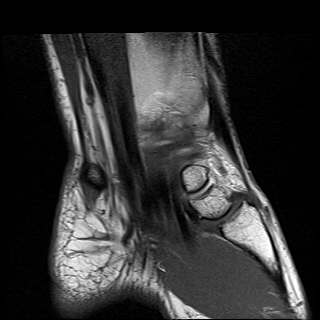
[im 23/28]
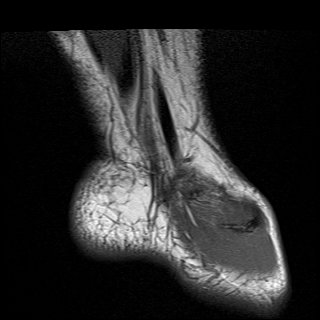
[im 28/28]
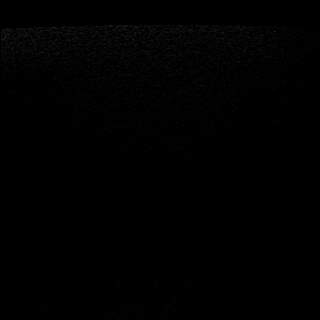

[Series 16: T2 fat-sat · sagittal · right · 2.0mm · 0.39mm/px · 8 of 32 slices shown (3 of 3)]
[im 1/32]
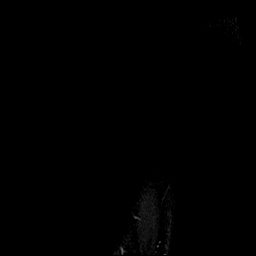
[im 5/32]
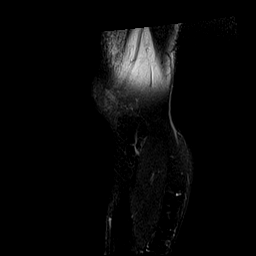
[im 9/32]
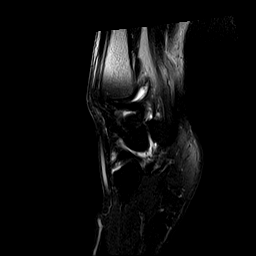
[im 14/32]
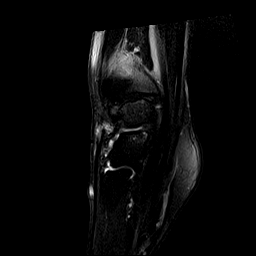
[im 18/32]
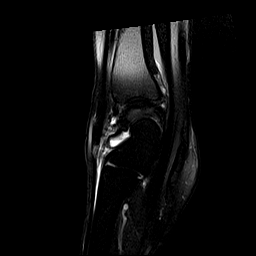
[im 23/32]
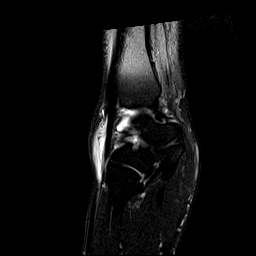
[im 27/32]
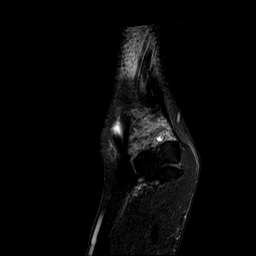
[im 32/32]
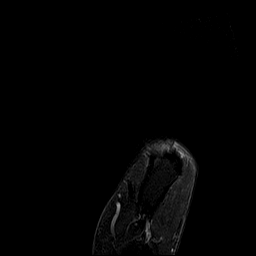

[Series 17: T1 fat-sat · axial · right · 2.0mm · 0.28mm/px · z∈[+49,+110]mm · 7 of 29 slices shown (2 of 2)]
[im 1/29]
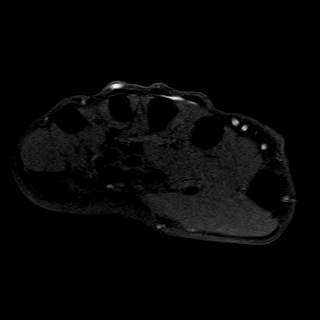
[im 5/29]
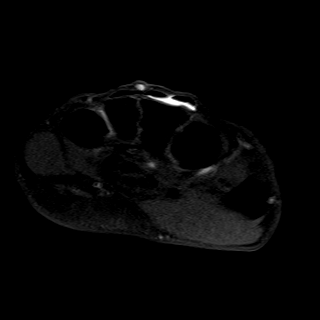
[im 10/29]
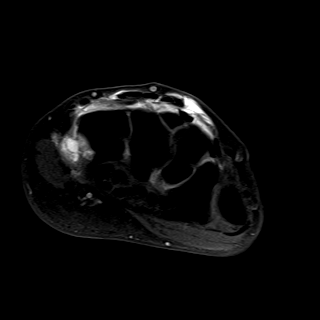
[im 15/29]
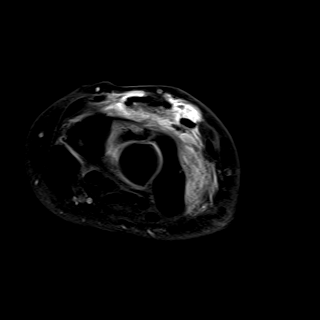
[im 19/29]
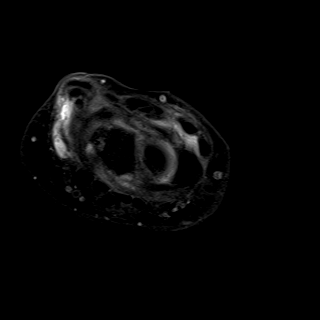
[im 24/29]
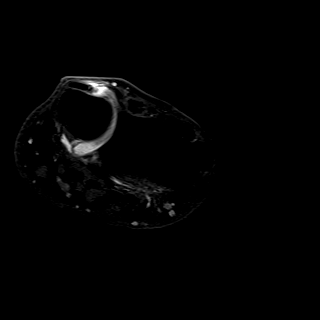
[im 29/29]
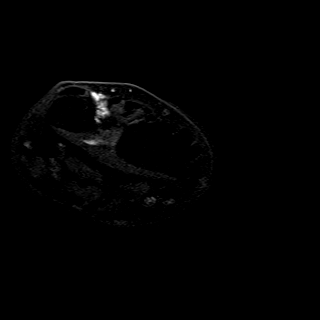

[40 of 40 positions shown; findings below may reference images not displayed]

FINDINGS: Ligaments: Extrinsic ligaments are intact. Scapholunate and
lunotriquetral ligaments are intact.

Triangular fibrocartilage: Advanced degenerative changes of the TFCC
with volar and dorsal radioulnar ligament degeneration about the
ulnar attachment with erosion of the ulnar styloid process. There is
also nonvisualization of the meniscal homologue.

Tendons: Flexor tendons are normal in size and signal. There is
thickening of the extensor carpi ulnaris concerning for
tendinopathy. Remaining extensor tendons are normal in size.

Carpal tunnel/median nerve: Flexor retinaculum is intact. Normal
carpal tunnel without a mass. Median nerve demonstrates normal
signal and caliber.

Guyon's canal: Normal Guyon's canal. Normal ulnar nerve.

Joint/cartilage: Advanced articular cartilage thinning of the
radiocarpal joints and intercarpal joints.

Bones/carpal alignment: No evidence of fracture or avascular
necrosis. Subchondral cystic changes of the lunate, abutting the
ulna as well as triquetrum and hamate. Normal alignment.

Other: Muscles are normal. No fluid collection, hematoma, or soft
tissue mass. Small ganglion cyst about the volar aspect of the
distal radius measuring approximately 0.4 x 0.6 cm.
IMPRESSION: 1. Advanced degeneration of the TFCC, erosion of the ulnar styloid
process and tendinopathy of the extensor carpi ulnaris.

2. Subchondral cystic changes of the lunate and triquetrum
concerning for ulnar abutment.

3 . Articular cartilage thinning of the radiocarpal and intercarpal
joints. No evidence of fracture or dislocation.

4. Small ganglion cyst about the volar aspect of the distal radius
measuring 0.4 x 0.6 cm.

## 2022-07-27 ENCOUNTER — Encounter: Payer: BC Managed Care – PPO | Admitting: Physical Therapy

## 2022-08-10 ENCOUNTER — Encounter: Payer: BC Managed Care – PPO | Admitting: Physical Therapy

## 2023-01-27 ENCOUNTER — Other Ambulatory Visit: Payer: Self-pay | Admitting: Internal Medicine

## 2023-01-27 DIAGNOSIS — Z1231 Encounter for screening mammogram for malignant neoplasm of breast: Secondary | ICD-10-CM

## 2023-03-01 ENCOUNTER — Ambulatory Visit
Admission: RE | Admit: 2023-03-01 | Discharge: 2023-03-01 | Disposition: A | Discharge status: Home / Self Care | Payer: BC Managed Care – PPO | Source: Ambulatory Visit | Attending: Internal Medicine | Admitting: Internal Medicine

## 2023-03-01 DIAGNOSIS — Z1231 Encounter for screening mammogram for malignant neoplasm of breast: Secondary | ICD-10-CM | POA: Insufficient documentation

## 2024-01-31 ENCOUNTER — Other Ambulatory Visit: Payer: Self-pay | Admitting: Internal Medicine

## 2024-01-31 DIAGNOSIS — Z1231 Encounter for screening mammogram for malignant neoplasm of breast: Secondary | ICD-10-CM

## 2024-02-21 DIAGNOSIS — R739 Hyperglycemia, unspecified: Secondary | ICD-10-CM | POA: Diagnosis not present

## 2024-02-21 DIAGNOSIS — E782 Mixed hyperlipidemia: Secondary | ICD-10-CM | POA: Diagnosis not present

## 2024-03-01 ENCOUNTER — Ambulatory Visit
Admission: RE | Admit: 2024-03-01 | Discharge: 2024-03-01 | Disposition: A | Discharge status: Home / Self Care | Payer: BC Managed Care – PPO | Source: Ambulatory Visit | Attending: Internal Medicine | Admitting: Internal Medicine

## 2024-03-01 DIAGNOSIS — Z1231 Encounter for screening mammogram for malignant neoplasm of breast: Secondary | ICD-10-CM | POA: Diagnosis not present

## 2024-03-06 DIAGNOSIS — N8110 Cystocele, unspecified: Secondary | ICD-10-CM | POA: Diagnosis not present

## 2024-03-06 DIAGNOSIS — Z0000 Encounter for general adult medical examination without abnormal findings: Secondary | ICD-10-CM | POA: Diagnosis not present

## 2024-03-07 DIAGNOSIS — Z124 Encounter for screening for malignant neoplasm of cervix: Secondary | ICD-10-CM | POA: Diagnosis not present

## 2024-03-07 DIAGNOSIS — Z13820 Encounter for screening for osteoporosis: Secondary | ICD-10-CM | POA: Diagnosis not present

## 2024-03-13 DIAGNOSIS — M8588 Other specified disorders of bone density and structure, other site: Secondary | ICD-10-CM | POA: Diagnosis not present

## 2024-03-15 DIAGNOSIS — M8580 Other specified disorders of bone density and structure, unspecified site: Secondary | ICD-10-CM | POA: Diagnosis not present

## 2024-03-15 DIAGNOSIS — R7303 Prediabetes: Secondary | ICD-10-CM | POA: Diagnosis not present

## 2024-03-15 DIAGNOSIS — E785 Hyperlipidemia, unspecified: Secondary | ICD-10-CM | POA: Diagnosis not present

## 2024-03-15 DIAGNOSIS — Z87891 Personal history of nicotine dependence: Secondary | ICD-10-CM | POA: Diagnosis not present

## 2024-03-15 DIAGNOSIS — E538 Deficiency of other specified B group vitamins: Secondary | ICD-10-CM | POA: Diagnosis not present

## 2024-03-15 DIAGNOSIS — K219 Gastro-esophageal reflux disease without esophagitis: Secondary | ICD-10-CM | POA: Diagnosis not present

## 2024-03-15 DIAGNOSIS — N182 Chronic kidney disease, stage 2 (mild): Secondary | ICD-10-CM | POA: Diagnosis not present

## 2024-03-15 DIAGNOSIS — M1990 Unspecified osteoarthritis, unspecified site: Secondary | ICD-10-CM | POA: Diagnosis not present

## 2024-03-15 DIAGNOSIS — R519 Headache, unspecified: Secondary | ICD-10-CM | POA: Diagnosis not present

## 2024-03-15 DIAGNOSIS — Z860100 Personal history of colon polyps, unspecified: Secondary | ICD-10-CM | POA: Diagnosis not present

## 2024-04-12 DIAGNOSIS — D485 Neoplasm of uncertain behavior of skin: Secondary | ICD-10-CM | POA: Diagnosis not present

## 2024-04-12 DIAGNOSIS — C44519 Basal cell carcinoma of skin of other part of trunk: Secondary | ICD-10-CM | POA: Diagnosis not present

## 2024-04-12 DIAGNOSIS — D225 Melanocytic nevi of trunk: Secondary | ICD-10-CM | POA: Diagnosis not present

## 2024-04-12 DIAGNOSIS — D2272 Melanocytic nevi of left lower limb, including hip: Secondary | ICD-10-CM | POA: Diagnosis not present

## 2024-04-12 DIAGNOSIS — D2261 Melanocytic nevi of right upper limb, including shoulder: Secondary | ICD-10-CM | POA: Diagnosis not present

## 2024-04-12 DIAGNOSIS — D2262 Melanocytic nevi of left upper limb, including shoulder: Secondary | ICD-10-CM | POA: Diagnosis not present

## 2024-04-12 DIAGNOSIS — C44719 Basal cell carcinoma of skin of left lower limb, including hip: Secondary | ICD-10-CM | POA: Diagnosis not present

## 2024-04-12 DIAGNOSIS — L821 Other seborrheic keratosis: Secondary | ICD-10-CM | POA: Diagnosis not present

## 2024-04-12 DIAGNOSIS — D2271 Melanocytic nevi of right lower limb, including hip: Secondary | ICD-10-CM | POA: Diagnosis not present

## 2024-05-17 DIAGNOSIS — N812 Incomplete uterovaginal prolapse: Secondary | ICD-10-CM | POA: Diagnosis not present

## 2024-05-17 DIAGNOSIS — N8111 Cystocele, midline: Secondary | ICD-10-CM | POA: Diagnosis not present

## 2024-05-29 DIAGNOSIS — C44519 Basal cell carcinoma of skin of other part of trunk: Secondary | ICD-10-CM | POA: Diagnosis not present

## 2024-05-29 DIAGNOSIS — C44719 Basal cell carcinoma of skin of left lower limb, including hip: Secondary | ICD-10-CM | POA: Diagnosis not present

## 2024-09-17 DIAGNOSIS — N812 Incomplete uterovaginal prolapse: Secondary | ICD-10-CM | POA: Diagnosis not present

## 2024-09-17 DIAGNOSIS — N8111 Cystocele, midline: Secondary | ICD-10-CM | POA: Diagnosis not present

## 2024-09-17 DIAGNOSIS — N3946 Mixed incontinence: Secondary | ICD-10-CM | POA: Diagnosis not present

## 2024-09-25 ENCOUNTER — Ambulatory Visit: Admitting: Physical Therapy

## 2024-10-02 ENCOUNTER — Ambulatory Visit: Admitting: Physical Therapy

## 2024-10-09 ENCOUNTER — Ambulatory Visit: Admitting: Physical Therapy

## 2024-10-16 ENCOUNTER — Ambulatory Visit: Admitting: Physical Therapy

## 2024-10-23 ENCOUNTER — Ambulatory Visit: Admitting: Physical Therapy

## 2024-10-29 ENCOUNTER — Other Ambulatory Visit: Payer: Self-pay | Admitting: Internal Medicine

## 2024-10-29 ENCOUNTER — Ambulatory Visit
Admission: RE | Admit: 2024-10-29 | Discharge: 2024-10-29 | Disposition: A | Discharge status: Home / Self Care | Source: Ambulatory Visit | Attending: Internal Medicine | Admitting: Internal Medicine

## 2024-10-29 DIAGNOSIS — R1031 Right lower quadrant pain: Secondary | ICD-10-CM | POA: Insufficient documentation

## 2024-10-29 DIAGNOSIS — R100 Acute abdomen: Secondary | ICD-10-CM | POA: Diagnosis not present

## 2024-10-29 MED ORDER — IOHEXOL 300 MG/ML  SOLN
100.0000 mL | Freq: Once | INTRAMUSCULAR | Status: AC | PRN
  Administered 2024-10-29: 100 mL via INTRAVENOUS

## 2024-10-30 ENCOUNTER — Ambulatory Visit: Admitting: Physical Therapy

## 2024-10-31 ENCOUNTER — Ambulatory Visit: Admitting: Physical Therapy

## 2024-11-06 ENCOUNTER — Ambulatory Visit: Admitting: Physical Therapy

## 2024-11-12 DIAGNOSIS — K219 Gastro-esophageal reflux disease without esophagitis: Secondary | ICD-10-CM | POA: Diagnosis not present

## 2024-11-12 DIAGNOSIS — N8111 Cystocele, midline: Secondary | ICD-10-CM | POA: Diagnosis not present

## 2024-11-12 DIAGNOSIS — N840 Polyp of corpus uteri: Secondary | ICD-10-CM | POA: Diagnosis not present

## 2024-11-12 DIAGNOSIS — Z87891 Personal history of nicotine dependence: Secondary | ICD-10-CM | POA: Diagnosis not present

## 2024-11-12 DIAGNOSIS — Z9049 Acquired absence of other specified parts of digestive tract: Secondary | ICD-10-CM | POA: Diagnosis not present

## 2024-11-12 DIAGNOSIS — Z885 Allergy status to narcotic agent status: Secondary | ICD-10-CM | POA: Diagnosis not present

## 2024-11-12 DIAGNOSIS — Z79899 Other long term (current) drug therapy: Secondary | ICD-10-CM | POA: Diagnosis not present

## 2024-11-12 DIAGNOSIS — D251 Intramural leiomyoma of uterus: Secondary | ICD-10-CM | POA: Diagnosis not present

## 2024-11-12 DIAGNOSIS — Z8719 Personal history of other diseases of the digestive system: Secondary | ICD-10-CM | POA: Diagnosis not present

## 2024-11-12 DIAGNOSIS — N86 Erosion and ectropion of cervix uteri: Secondary | ICD-10-CM | POA: Diagnosis not present

## 2024-11-12 DIAGNOSIS — Z888 Allergy status to other drugs, medicaments and biological substances status: Secondary | ICD-10-CM | POA: Diagnosis not present

## 2024-11-12 DIAGNOSIS — Z98891 History of uterine scar from previous surgery: Secondary | ICD-10-CM | POA: Diagnosis not present

## 2024-11-12 DIAGNOSIS — N813 Complete uterovaginal prolapse: Secondary | ICD-10-CM | POA: Diagnosis not present

## 2024-11-12 NOTE — Progress Notes (Signed)
 Anesthesia Transfer of Care Note  Patient: Debbie Newman  Procedures performed: Vaginal hysterectomy (Vagina ) Uterosacral ligament suspension (Vagina ) Anterior repair (Vagina ) Cystoscopy (Urethra)  Report given/handover to: PACU Nurse

## 2024-11-12 NOTE — Procedures (Signed)
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]  Procedure(s): Vaginal hysterectomy Uterosacral ligament suspension Anterior repair Cystoscopy   SURGEON: Surgeons and Role:    * Lee, Dorthea Kay, MD - Primary    * Jackquline Beverley ORN, MD - Resident - Assisting    * Teressa, Morene Cough, MD - Resident - Assisting    * Evelia Baar, MD - Fellow  Anesthesia: GEN/Local  Staff:   Circulator: Jacquline Heather BROCKS, RN; Sherlean Last, RN Scrub Person: Mitchell Mate; Jacquline Heather C, RN  Estimated Blood Loss:    100 mL from 11/12/2024 10:19 AM to 11/12/2024  1:37 PM             Specimens:  Order Name Source Comment Collection Info Order Time  TYPE AND SCREEN   Collected By: Darroll Millman, RN 11/12/2024  8:30 AM    Release to patient   Immediate       CONFIRMATORY ABO, RH   Collected By: Jolee Sor, RN 11/12/2024  8:32 AM    Release to patient   Immediate       TYPE AND SCREEN    11/12/2024  8:36 AM    Release to patient   Immediate       CONFIRMATORY ABO, [facility]    11/12/2024  8:36 AM    Release to patient   Immediate       PATHOLOGY - GENERAL / OTHER Uterus  Collected By: Lee Dorthea Kay, MD 11/12/2024 11:48 AM    Release to patient   Immediate                Drains:        BAAR EVELIA   Date: 11/12/2024  Time: 1:48 PM

## 2024-11-13 ENCOUNTER — Ambulatory Visit: Admitting: Physical Therapy

## 2024-11-16 ENCOUNTER — Other Ambulatory Visit: Payer: Self-pay

## 2024-11-16 ENCOUNTER — Emergency Department
Admission: EM | Admit: 2024-11-16 | Discharge: 2024-11-16 | Disposition: A | Discharge status: Home / Self Care | Attending: Emergency Medicine | Admitting: Emergency Medicine

## 2024-11-16 DIAGNOSIS — R197 Diarrhea, unspecified: Secondary | ICD-10-CM | POA: Diagnosis not present

## 2024-11-16 DIAGNOSIS — Z789 Other specified health status: Secondary | ICD-10-CM

## 2024-11-16 DIAGNOSIS — R110 Nausea: Secondary | ICD-10-CM | POA: Insufficient documentation

## 2024-11-16 DIAGNOSIS — R208 Other disturbances of skin sensation: Secondary | ICD-10-CM | POA: Insufficient documentation

## 2024-11-16 DIAGNOSIS — R112 Nausea with vomiting, unspecified: Secondary | ICD-10-CM | POA: Diagnosis not present

## 2024-11-16 LAB — URINALYSIS, ROUTINE W REFLEX MICROSCOPIC
Bacteria, UA: NONE SEEN
Bilirubin Urine: NEGATIVE
Glucose, UA: NEGATIVE mg/dL
Ketones, ur: NEGATIVE mg/dL
Nitrite: NEGATIVE
Protein, ur: NEGATIVE mg/dL
Specific Gravity, Urine: 1.004 — ABNORMAL LOW (ref 1.005–1.030)
pH: 7 (ref 5.0–8.0)

## 2024-11-16 LAB — COMPREHENSIVE METABOLIC PANEL WITH GFR
ALT: 29 U/L (ref 0–44)
AST: 32 U/L (ref 15–41)
Albumin: 3.9 g/dL (ref 3.5–5.0)
Alkaline Phosphatase: 98 U/L (ref 38–126)
Anion gap: 10 (ref 5–15)
BUN: 9 mg/dL (ref 8–23)
CO2: 27 mmol/L (ref 22–32)
Calcium: 9.4 mg/dL (ref 8.9–10.3)
Chloride: 107 mmol/L (ref 98–111)
Creatinine, Ser: 0.85 mg/dL (ref 0.44–1.00)
GFR, Estimated: >60 mL/min (ref >60)
Glucose, Bld: 103 mg/dL — ABNORMAL HIGH (ref 70–99)
Potassium: 3.6 mmol/L (ref 3.5–5.1)
Sodium: 144 mmol/L (ref 135–145)
Total Bilirubin: 0.5 mg/dL (ref 0.0–1.2)
Total Protein: 6.5 g/dL (ref 6.5–8.1)

## 2024-11-16 LAB — CBC
HCT: 37.6 % (ref 36.0–46.0)
Hemoglobin: 12.4 g/dL (ref 12.0–15.0)
MCH: 31.2 pg (ref 26.0–34.0)
MCHC: 33 g/dL (ref 30.0–36.0)
MCV: 94.7 fL (ref 80.0–100.0)
Platelets: 300 K/uL (ref 150–400)
RBC: 3.97 MIL/uL (ref 3.87–5.11)
RDW: 12.7 % (ref 11.5–15.5)
WBC: 9.6 K/uL (ref 4.0–10.5)
nRBC: 0 % (ref 0.0–0.2)

## 2024-11-16 LAB — LIPASE, BLOOD: Lipase: 29 U/L (ref 11–51)

## 2024-11-16 MED ORDER — SODIUM CHLORIDE 0.9 % IV BOLUS
1000.0000 mL | Freq: Once | INTRAVENOUS | Status: AC
  Administered 2024-11-16: 1000 mL via INTRAVENOUS

## 2024-11-16 MED ORDER — METOCLOPRAMIDE HCL 10 MG PO TABS: 10.0000 mg | ORAL_TABLET | Freq: Three times a day (TID) | ORAL | 1 refills | Status: AC | PRN

## 2024-11-16 MED ORDER — METOCLOPRAMIDE HCL 5 MG/ML IJ SOLN
10.0000 mg | Freq: Once | INTRAMUSCULAR | Status: AC
  Administered 2024-11-16: 10 mg via INTRAVENOUS
  Filled 2024-11-16: qty 2

## 2024-11-16 MED ORDER — ONDANSETRON HCL 4 MG/2ML IJ SOLN
4.0000 mg | Freq: Once | INTRAMUSCULAR | Status: AC
  Administered 2024-11-16: 4 mg via INTRAVENOUS
  Filled 2024-11-16: qty 2

## 2024-11-16 NOTE — ED Triage Notes (Signed)
 Pt to ED for nausea and diarrhea since last night. Pt had hysterectomy on Monday. States last night she was shaking and both her legs and feet were numb. 5 episodes of diarrhea today. Denies pain.

## 2024-11-16 NOTE — ED Notes (Signed)
 Pt to toilet to have BM. Pt educated on collecting UA.

## 2024-11-16 NOTE — ED Provider Notes (Signed)
 [facility] Provider Note    Event Date/Time   First MD Initiated Contact with Patient 11/16/24 1534     (approximate)   History   Diarrhea   HPI  Debbie Newman is a 63 y.o. female who presents to the emergency department today because of concerns for nausea, diarrhea and change in sensation to her feet.  The patient started feeling nauseous yesterday.  She never vomited.  Today however she has had multiple episodes of diarrhea.  In addition she felt like her feet were changing in sensation.  She has a hard time describing what she means.  She states that 1 time it feels like they are in a box and may be heavy but also that the toes are tight and hard to move.  She cannot feel in her feet.  She denies similar symptoms in her feet in the past.  She has had some numbness to one of her fingers in her left upper arm, she has spoken to her primary care about this.  Patient did have hysterectomy performed 4 days ago.      Physical Exam   Triage Vital Signs: ED Triage Vitals  Encounter Vitals Group     BP 11/16/24 1524 (!) 148/79     Girls Systolic BP Percentile --      Girls Diastolic BP Percentile --      Boys Systolic BP Percentile --      Boys Diastolic BP Percentile --      Pulse Rate 11/16/24 1524 86     Resp 11/16/24 1524 20     Temp 11/16/24 1524 98 F (36.7 C)     Temp Source 11/16/24 1524 Oral     SpO2 11/16/24 1524 99 %     Weight 11/16/24 1526 167 lb (75.8 kg)     Height 11/16/24 1526 5' 7 (1.702 m)     Head Circumference --      Peak Flow --      Pain Score 11/16/24 1524 0     Pain Loc --      Pain Education --      Exclude from Growth Chart --     Most recent vital signs: Vitals:   11/16/24 1524  BP: (!) 148/79  Pulse: 86  Resp: 20  Temp: 98 F (36.7 C)  SpO2: 99%   General: Awake, alert, oriented. CV:  Good peripheral perfusion. Regular rate and rhythm. Resp:  Normal effort. Lungs clear. Abd:  No distention.   Neuro:  PERRL. EOMI. Face symmetric. Tongue midline. Strength 5/5 in upper and lower extremities. Sensation grossly intact.  ED Results / Procedures / Treatments   Labs (all labs ordered are listed, but only abnormal results are displayed) Labs Reviewed  COMPREHENSIVE METABOLIC PANEL WITH GFR - Abnormal; Notable for the following components:      Result Value   Glucose, Bld 103 (*)    All other components within normal limits  URINALYSIS, ROUTINE W REFLEX MICROSCOPIC - Abnormal; Notable for the following components:   Color, Urine STRAW (*)    APPearance CLEAR (*)    Specific Gravity, Urine 1.004 (*)    Hgb urine dipstick MODERATE (*)    Leukocytes,Ua TRACE (*)    All other components within normal limits  LIPASE, BLOOD  CBC     EKG  None   RADIOLOGY None  PROCEDURES:  Critical Care performed: No    MEDICATIONS ORDERED IN ED: Medications - No data to  display   IMPRESSION / MDM / ASSESSMENT AND PLAN / ED COURSE  I reviewed the triage vital signs and the nursing notes.                              Differential diagnosis includes, but is not limited to, gastroenteritis, hepatitis, gastritis, pancreatitis  Patient's presentation is most consistent with acute presentation with potential threat to life or bodily function.  Patient presented to the emergency department today because of concerns for nausea, diarrhea as well as change in sensation to her feet.  On exam patient does have sensation to her feet.  Dorsalis pedis pulse 2+ bilaterally.  No skin change.  No swelling.  Unclear etiology.  I would have low concern for CVA given bilateral nature.  Patient without any other focal neurodeficits.  Blood work without concerning electrolyte abnormalities or leukocytosis.  Patient did feel better after IV fluids and medication.  At this time somewhat unclear etiology however today is reasonable for patient to be discharged.  Will give patient neurology follow-up  information.  Additionally encourage patient follow-up with primary care.   FINAL CLINICAL IMPRESSION(S) / ED DIAGNOSES   Final diagnoses:  Diarrhea, unspecified type  Nausea  Tightness sensation     Note:  This document was prepared using Dragon voice recognition software and may include unintentional dictation errors.    Floy Roberts, MD 11/16/24 [phone]

## 2024-11-20 ENCOUNTER — Ambulatory Visit: Admitting: Physical Therapy

## 2024-11-27 ENCOUNTER — Ambulatory Visit: Admitting: Physical Therapy

## 2024-12-04 ENCOUNTER — Ambulatory Visit: Admitting: Physical Therapy
